# Patient Record
Sex: Female | Born: 1955 | Race: Black or African American | Hispanic: No | Marital: Married | Smoking: Former smoker
Health system: Southern US, Community
[De-identification: ages and names within clinical notes are randomized; demographics above are authoritative.]

## PROBLEM LIST (undated history)

## (undated) DIAGNOSIS — J45909 Unspecified asthma, uncomplicated: Secondary | ICD-10-CM

## (undated) DIAGNOSIS — J329 Chronic sinusitis, unspecified: Secondary | ICD-10-CM

## (undated) DIAGNOSIS — M722 Plantar fascial fibromatosis: Secondary | ICD-10-CM

## (undated) DIAGNOSIS — K219 Gastro-esophageal reflux disease without esophagitis: Secondary | ICD-10-CM

## (undated) DIAGNOSIS — J189 Pneumonia, unspecified organism: Secondary | ICD-10-CM

## (undated) DIAGNOSIS — M1990 Unspecified osteoarthritis, unspecified site: Secondary | ICD-10-CM

## (undated) DIAGNOSIS — E7800 Pure hypercholesterolemia, unspecified: Secondary | ICD-10-CM

## (undated) DIAGNOSIS — I10 Essential (primary) hypertension: Secondary | ICD-10-CM

## (undated) DIAGNOSIS — M7550 Bursitis of unspecified shoulder: Secondary | ICD-10-CM

## (undated) DIAGNOSIS — G5603 Carpal tunnel syndrome, bilateral upper limbs: Secondary | ICD-10-CM

## (undated) HISTORY — PX: TUBAL LIGATION: SHX77

## (undated) HISTORY — PX: APPENDECTOMY: SHX54

## (undated) HISTORY — PX: TONSILLECTOMY: SUR1361

## (undated) HISTORY — PX: BILATERAL CARPAL TUNNEL RELEASE: SHX6508

## (undated) HISTORY — PX: COLONOSCOPY: SHX174

---

## 1998-07-19 ENCOUNTER — Encounter: Admission: RE | Admit: 1998-07-19 | Discharge: 1998-07-19 | Payer: Self-pay | Admitting: *Deleted

## 2000-04-01 ENCOUNTER — Other Ambulatory Visit: Admission: RE | Admit: 2000-04-01 | Discharge: 2000-04-01 | Payer: Self-pay | Admitting: Family Medicine

## 2000-04-30 ENCOUNTER — Encounter: Payer: Self-pay | Admitting: Family Medicine

## 2000-04-30 ENCOUNTER — Encounter: Admission: RE | Admit: 2000-04-30 | Discharge: 2000-04-30 | Payer: Self-pay | Admitting: Family Medicine

## 2001-05-12 ENCOUNTER — Encounter: Payer: Self-pay | Admitting: Family Medicine

## 2001-05-12 ENCOUNTER — Encounter: Admission: RE | Admit: 2001-05-12 | Discharge: 2001-05-12 | Payer: Self-pay | Admitting: Family Medicine

## 2002-01-02 ENCOUNTER — Encounter: Admission: RE | Admit: 2002-01-02 | Discharge: 2002-01-02 | Payer: Self-pay | Admitting: Internal Medicine

## 2002-01-02 ENCOUNTER — Encounter: Payer: Self-pay | Admitting: Family Medicine

## 2002-02-08 ENCOUNTER — Encounter: Admission: RE | Admit: 2002-02-08 | Discharge: 2002-02-08 | Payer: Self-pay | Admitting: Family Medicine

## 2002-02-08 ENCOUNTER — Encounter: Payer: Self-pay | Admitting: Family Medicine

## 2002-02-09 ENCOUNTER — Encounter: Admission: RE | Admit: 2002-02-09 | Discharge: 2002-02-09 | Payer: Self-pay | Admitting: Family Medicine

## 2002-02-09 ENCOUNTER — Encounter: Payer: Self-pay | Admitting: Family Medicine

## 2002-08-28 ENCOUNTER — Encounter: Admission: RE | Admit: 2002-08-28 | Discharge: 2002-08-28 | Payer: Self-pay | Admitting: Family Medicine

## 2002-08-28 ENCOUNTER — Encounter: Payer: Self-pay | Admitting: Family Medicine

## 2003-03-15 ENCOUNTER — Ambulatory Visit (HOSPITAL_COMMUNITY): Admission: RE | Admit: 2003-03-15 | Discharge: 2003-03-15 | Payer: Self-pay | Admitting: Family Medicine

## 2003-03-15 ENCOUNTER — Encounter: Payer: Self-pay | Admitting: Internal Medicine

## 2003-03-15 ENCOUNTER — Ambulatory Visit (HOSPITAL_COMMUNITY): Admission: RE | Admit: 2003-03-15 | Discharge: 2003-03-15 | Payer: Self-pay | Admitting: Internal Medicine

## 2007-11-04 ENCOUNTER — Encounter: Admission: RE | Admit: 2007-11-04 | Discharge: 2007-11-04 | Payer: Self-pay | Admitting: Gastroenterology

## 2012-02-01 ENCOUNTER — Ambulatory Visit (INDEPENDENT_AMBULATORY_CARE_PROVIDER_SITE_OTHER): Payer: BC Managed Care – PPO | Admitting: Family Medicine

## 2012-02-01 VITALS — BP 135/88 | HR 94 | Temp 98.1°F | Resp 20 | Ht 63.0 in | Wt 208.0 lb

## 2012-02-01 DIAGNOSIS — J019 Acute sinusitis, unspecified: Secondary | ICD-10-CM

## 2012-02-01 NOTE — Progress Notes (Signed)
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 She has a one-week history of progressive sinus congestion, cough and facial pain. She's had some chills and is subjective fever.  She also had a dry cough.  Objective: Marked mucopurulent discharge with nasal passage swelling. TMs normal. Oropharynx red. Chest clear. Heart regular.  6 assessment: Sinusitis  Plan: Levaquin, Flonase

## 2012-06-23 ENCOUNTER — Other Ambulatory Visit: Payer: Self-pay | Admitting: Internal Medicine

## 2012-06-23 ENCOUNTER — Other Ambulatory Visit (HOSPITAL_COMMUNITY)
Admission: RE | Admit: 2012-06-23 | Discharge: 2012-06-23 | Disposition: A | Discharge status: Home / Self Care | Payer: BC Managed Care – PPO | Source: Ambulatory Visit | Attending: Internal Medicine | Admitting: Internal Medicine

## 2012-06-23 DIAGNOSIS — Z1151 Encounter for screening for human papillomavirus (HPV): Secondary | ICD-10-CM | POA: Insufficient documentation

## 2012-06-23 DIAGNOSIS — Z01419 Encounter for gynecological examination (general) (routine) without abnormal findings: Secondary | ICD-10-CM | POA: Insufficient documentation

## 2012-06-23 DIAGNOSIS — Z1231 Encounter for screening mammogram for malignant neoplasm of breast: Secondary | ICD-10-CM

## 2012-07-05 ENCOUNTER — Ambulatory Visit
Admission: RE | Admit: 2012-07-05 | Discharge: 2012-07-05 | Disposition: A | Discharge status: Home / Self Care | Payer: BC Managed Care – PPO | Source: Ambulatory Visit | Attending: Internal Medicine | Admitting: Internal Medicine

## 2012-07-05 DIAGNOSIS — Z1231 Encounter for screening mammogram for malignant neoplasm of breast: Secondary | ICD-10-CM

## 2013-03-10 ENCOUNTER — Other Ambulatory Visit: Payer: Self-pay

## 2013-06-30 ENCOUNTER — Other Ambulatory Visit (HOSPITAL_COMMUNITY)
Admission: RE | Admit: 2013-06-30 | Discharge: 2013-06-30 | Disposition: A | Discharge status: Home / Self Care | Payer: BC Managed Care – PPO | Source: Ambulatory Visit | Attending: Internal Medicine | Admitting: Internal Medicine

## 2013-06-30 ENCOUNTER — Other Ambulatory Visit: Payer: Self-pay | Admitting: Internal Medicine

## 2013-06-30 DIAGNOSIS — Z01419 Encounter for gynecological examination (general) (routine) without abnormal findings: Secondary | ICD-10-CM | POA: Insufficient documentation

## 2014-04-17 ENCOUNTER — Ambulatory Visit
Admission: RE | Admit: 2014-04-17 | Discharge: 2014-04-17 | Disposition: A | Discharge status: Home / Self Care | Payer: BC Managed Care – PPO | Source: Ambulatory Visit | Attending: Internal Medicine | Admitting: Internal Medicine

## 2014-04-17 ENCOUNTER — Other Ambulatory Visit: Payer: Self-pay | Admitting: Internal Medicine

## 2014-04-17 DIAGNOSIS — J209 Acute bronchitis, unspecified: Secondary | ICD-10-CM

## 2014-05-14 ENCOUNTER — Other Ambulatory Visit: Payer: Self-pay

## 2014-05-14 DIAGNOSIS — Z1231 Encounter for screening mammogram for malignant neoplasm of breast: Secondary | ICD-10-CM

## 2014-05-25 ENCOUNTER — Ambulatory Visit
Admission: RE | Admit: 2014-05-25 | Discharge: 2014-05-25 | Disposition: A | Discharge status: Home / Self Care | Payer: BC Managed Care – PPO | Source: Ambulatory Visit

## 2014-05-25 DIAGNOSIS — Z1231 Encounter for screening mammogram for malignant neoplasm of breast: Secondary | ICD-10-CM

## 2014-05-29 ENCOUNTER — Other Ambulatory Visit: Payer: Self-pay | Admitting: Internal Medicine

## 2014-05-29 DIAGNOSIS — R928 Other abnormal and inconclusive findings on diagnostic imaging of breast: Secondary | ICD-10-CM

## 2014-06-11 ENCOUNTER — Encounter (INDEPENDENT_AMBULATORY_CARE_PROVIDER_SITE_OTHER): Payer: Self-pay

## 2014-06-11 ENCOUNTER — Ambulatory Visit
Admission: RE | Admit: 2014-06-11 | Discharge: 2014-06-11 | Disposition: A | Discharge status: Home / Self Care | Payer: BC Managed Care – PPO | Source: Ambulatory Visit | Attending: Internal Medicine | Admitting: Internal Medicine

## 2014-06-11 DIAGNOSIS — R928 Other abnormal and inconclusive findings on diagnostic imaging of breast: Secondary | ICD-10-CM

## 2014-07-01 ENCOUNTER — Other Ambulatory Visit (HOSPITAL_COMMUNITY)
Admission: RE | Admit: 2014-07-01 | Discharge: 2014-07-01 | Disposition: A | Discharge status: Home / Self Care | Payer: BC Managed Care – PPO | Source: Ambulatory Visit | Attending: Internal Medicine | Admitting: Internal Medicine

## 2014-07-01 ENCOUNTER — Other Ambulatory Visit: Payer: Self-pay | Admitting: Internal Medicine

## 2014-07-01 DIAGNOSIS — Z01419 Encounter for gynecological examination (general) (routine) without abnormal findings: Secondary | ICD-10-CM | POA: Insufficient documentation

## 2014-07-04 LAB — CYTOLOGY - PAP

## 2014-09-25 ENCOUNTER — Emergency Department (HOSPITAL_COMMUNITY)
Admission: EM | Admit: 2014-09-25 | Discharge: 2014-09-25 | Disposition: A | Discharge status: Home / Self Care | Payer: BC Managed Care – PPO | Source: Home / Self Care | Attending: Family Medicine | Admitting: Family Medicine

## 2014-09-25 ENCOUNTER — Encounter (HOSPITAL_COMMUNITY): Payer: Self-pay | Admitting: Emergency Medicine

## 2014-09-25 DIAGNOSIS — J0101 Acute recurrent maxillary sinusitis: Secondary | ICD-10-CM

## 2014-09-25 MED ORDER — METHYLPREDNISOLONE ACETATE 80 MG/ML IJ SUSP
INTRAMUSCULAR | Status: AC
  Filled 2014-09-25: qty 1

## 2014-09-25 MED ORDER — IPRATROPIUM BROMIDE 0.02 % IN SOLN
0.5000 mg | Freq: Once | RESPIRATORY_TRACT | Status: AC
  Administered 2014-09-25: 0.5 mg via RESPIRATORY_TRACT

## 2014-09-25 MED ORDER — IPRATROPIUM BROMIDE 0.06 % NA SOLN: 2.0000 | Freq: Four times a day (QID) | NASAL | Status: DC

## 2014-09-25 MED ORDER — ALBUTEROL SULFATE (2.5 MG/3ML) 0.083% IN NEBU
5.0000 mg | INHALATION_SOLUTION | Freq: Once | RESPIRATORY_TRACT | Status: AC
  Administered 2014-09-25: 5 mg via RESPIRATORY_TRACT

## 2014-09-25 MED ORDER — MINOCYCLINE HCL 100 MG PO CAPS: 100.0000 mg | ORAL_CAPSULE | Freq: Two times a day (BID) | ORAL | Status: DC

## 2014-09-25 MED ORDER — METHYLPREDNISOLONE ACETATE 40 MG/ML IJ SUSP
80.0000 mg | Freq: Once | INTRAMUSCULAR | Status: AC
  Administered 2014-09-25: 80 mg via INTRAMUSCULAR

## 2014-09-25 MED ORDER — IPRATROPIUM BROMIDE 0.02 % IN SOLN
RESPIRATORY_TRACT | Status: AC
  Filled 2014-09-25: qty 2.5

## 2014-09-25 MED ORDER — TRIAMCINOLONE ACETONIDE 40 MG/ML IJ SUSP
40.0000 mg | Freq: Once | INTRAMUSCULAR | Status: AC
  Administered 2014-09-25: 40 mg via INTRAMUSCULAR

## 2014-09-25 MED ORDER — TRIAMCINOLONE ACETONIDE 40 MG/ML IJ SUSP
INTRAMUSCULAR | Status: AC
  Filled 2014-09-25: qty 1

## 2014-09-25 MED ORDER — ALBUTEROL SULFATE (2.5 MG/3ML) 0.083% IN NEBU
INHALATION_SOLUTION | RESPIRATORY_TRACT | Status: AC
Start: 2014-09-25 — End: 2014-09-25
  Filled 2014-09-25: qty 6

## 2014-09-25 NOTE — Discharge Instructions (Signed)
Drink plenty of fluids as discussed, use medicine as prescribed, and mucinex or delsym for cough. Return or see your doctor if further problems °

## 2014-09-25 NOTE — ED Provider Notes (Signed)
CSN: [id number]     Arrival date & time 09/25/14  [id number] History   First MD Initiated Contact with Patient 09/25/14 [phone]     Chief Complaint  Patient presents with  . URI   (Consider location/radiation/quality/duration/timing/severity/associated sxs/prior Treatment) Patient is a 58 y.o. female presenting with URI. The history is provided by the patient.  URI Presenting symptoms: congestion, cough, facial pain and rhinorrhea   Presenting symptoms: no fever   Severity:  Mild Onset quality:  Gradual Duration:  3 weeks Progression:  Unchanged Chronicity:  New Relieved by:  None tried Ineffective treatments:  None tried Associated symptoms: sinus pain and sneezing   Associated symptoms: no wheezing   Associated symptoms comment:  Sx of vertigo, n/v. Risk factors comment:  Asthma   History reviewed. No pertinent past medical history. History reviewed. No pertinent past surgical history. History reviewed. No pertinent family history. History  Substance Use Topics  . Smoking status: Former Smoker    Types: Cigarettes    Quit date: 01/31/1973  . Smokeless tobacco: Not on file  . Alcohol Use: Not on file   OB History   Grav Para Term Preterm Abortions TAB SAB Ect Mult Living                 Review of Systems  Constitutional: Negative.  Negative for fever and chills.  HENT: Positive for congestion, postnasal drip, rhinorrhea and sneezing.   Respiratory: Positive for cough. Negative for wheezing.   Cardiovascular: Negative.   Neurological: Positive for dizziness.    Allergies  Penicillins  Home Medications   Prior to Admission medications   Medication Sig Start Date End Date Taking? Authorizing Provider  amLODipine (NORVASC) 10 MG tablet Take 10 mg by mouth daily.   Yes Historical Provider, MD  ATORVASTATIN CALCIUM PO Take by mouth.   Yes Historical Provider, MD  albuterol (PROVENTIL HFA;VENTOLIN HFA) 108 (90 BASE) MCG/ACT inhaler Inhale 2 puffs into the lungs as needed.     Historical Provider, MD  ipratropium (ATROVENT) 0.06 % nasal spray Place 2 sprays into both nostrils 4 (four) times daily. 09/25/14   Linna HoffJames D Morse Brueggemann, MD  minocycline (MINOCIN,DYNACIN) 100 MG capsule Take 1 capsule (100 mg total) by mouth 2 (two) times daily. 09/25/14   Linna HoffJames D Tasheena Wambolt, MD  montelukast (SINGULAIR) 10 MG tablet Take 10 mg by mouth as needed.    Historical Provider, MD   BP 113/78  Pulse 86  Temp(Src) 98.3 F (36.8 C) (Oral)  Resp 16  SpO2 94%  LMP 02/01/1992 Physical Exam  Nursing note and vitals reviewed. Constitutional: She is oriented to person, place, and time. She appears well-developed and well-nourished.  HENT:  Head: Normocephalic.  Right Ear: External ear normal.  Left Ear: External ear normal.  Nose: Mucosal edema and rhinorrhea present.  Mouth/Throat: Oropharynx is clear and moist.  Eyes: Conjunctivae and EOM are normal. Pupils are equal, round, and reactive to light.  Neck: Normal range of motion. Neck supple.  Cardiovascular: Normal heart sounds and intact distal pulses.   Pulmonary/Chest: Effort normal. She has wheezes.  Lymphadenopathy:    She has no cervical adenopathy.  Neurological: She is alert and oriented to person, place, and time.  Skin: Skin is warm and dry.    ED Course  Procedures (including critical care time) Labs Review Labs Reviewed - No data to display  Imaging Review No results found.   MDM   1. Acute recurrent maxillary sinusitis   has had seasonal flu shot  recently    Linna HoffJames D Izyk Marty, MD 09/25/14 [phone]

## 2014-09-25 NOTE — ED Notes (Signed)
Pt  Reports   Symptoms  Of  Cough  Congestion         Sinus     Drainage           X  sev  Weeks    yest  Developed   Nausea   Vomiting   With  Some  dizzyness  As   Well        Symptoms  Not  releived  By  otc  meds

## 2015-06-11 ENCOUNTER — Other Ambulatory Visit: Payer: Self-pay

## 2015-06-11 DIAGNOSIS — Z1231 Encounter for screening mammogram for malignant neoplasm of breast: Secondary | ICD-10-CM

## 2015-07-16 ENCOUNTER — Ambulatory Visit: Payer: BC Managed Care – PPO

## 2015-08-22 ENCOUNTER — Ambulatory Visit
Admission: RE | Admit: 2015-08-22 | Discharge: 2015-08-22 | Disposition: A | Discharge status: Home / Self Care | Payer: BC Managed Care – PPO | Source: Ambulatory Visit

## 2015-08-22 DIAGNOSIS — Z1231 Encounter for screening mammogram for malignant neoplasm of breast: Secondary | ICD-10-CM

## 2016-01-21 ENCOUNTER — Other Ambulatory Visit (HOSPITAL_COMMUNITY)
Admission: RE | Admit: 2016-01-21 | Discharge: 2016-01-21 | Disposition: A | Discharge status: Home / Self Care | Payer: BC Managed Care – PPO | Source: Ambulatory Visit | Attending: Obstetrics and Gynecology | Admitting: Obstetrics and Gynecology

## 2016-01-21 ENCOUNTER — Other Ambulatory Visit: Payer: Self-pay | Admitting: Obstetrics and Gynecology

## 2016-01-21 DIAGNOSIS — Z1151 Encounter for screening for human papillomavirus (HPV): Secondary | ICD-10-CM | POA: Diagnosis present

## 2016-01-21 DIAGNOSIS — Z01411 Encounter for gynecological examination (general) (routine) with abnormal findings: Secondary | ICD-10-CM | POA: Diagnosis present

## 2016-01-23 LAB — CYTOLOGY - PAP

## 2016-03-12 ENCOUNTER — Other Ambulatory Visit (HOSPITAL_COMMUNITY): Payer: Self-pay | Admitting: Obstetrics and Gynecology

## 2016-03-12 NOTE — H&P (Signed)
Reason for Appointment  1. PreOp for 03/18/16   History of Present Illness  General:  60 y/o presents for preop visit. she is scheduled for hysteroscopy D&C with possible polypectomy on 03/18/2016. she was referred by Dr. Renford Dillsonald Polite for the evaluaiton of postmenopausal bleeding. she noticed spotting in November 2016. she notieced it again in December 2016 that lasted 2-3 days. It was very light. she has been postmenopausal for 20 years.  she is not sexually active. Ultrasound performed 01/23/2016 shows an 8.4 cm x 4.6 cm x 4.6 cm uterus. 2 small fibroids were measured the largest in 1.3 cm. Her enometrium is thickend with a hyperechoic mass 1.1 cm. her ovaries appear normal bilaterally.     Current Medications  Taking   Omega 3 1000 MG Capsule 1 capsule with a meal Orally Once a day   MVI as directed   Calcium 600+D(Calcium-Vitamin D) 600-400 MG-UNIT Tablet 1 tablet with food Orally Once a day   Biotin 1000 MCG Tablet 1 tablet Orally Once a day   Flexeril(Cyclobenzaprine HCl) 10 MG Tablet 1 tablet Orally prn   Albuterol Sulfate HFA 108 (90 Base) MCG/ACT Aerosol Solution 2 puffs as needed Inhalation prn   Naphcon-A(Naphazoline-Pheniramine) 0.025-0.3 % Solution 1-2 drops into affected eye as needed Ophthalmic 3-4 times a day   Singulair(Montelukast Sodium) 10 MG Tablet 1 tablet in the evening Orally Once a day   Norvasc(AmLODIPine Besylate) 5 MG Tablet 1 tablet Orally Once a day   Fluticasone Propionate 50 MCG/ACT Suspension 2 sprays Nasally Once a day   Zetia(Ezetimibe) 10 MG Tablet 1 tablet Orally Once a day   HCTZ 25 MG . Tablet 1 tablet Orally q AM   Not-Taking/PRN   Tramadol HCl 50 MG Tablet 1 tablet as needed Orally every 12 hrs prn   Norco(Acetaminophen-Hydrocodone) 5-325 MG Tablet 1 tablet as needed Orally every 12 hrs prn   Ibuprofen 600 MG Tablet 1 tablet Orally Three times a day prn   Discontinued   Azithromycin 250 MG Tablet 2 tablets on the first day, then 1 tablet daily  for 4 days Orally Once a day   Medication List reviewed and reconciled with the patient    Past Medical History  Arthritis knees, hands, back.   Asthma.   Hypercholesterolemia.   Overweight.   Low back pain.   Hypertension.   Plantar fasciitis, bilateral.   Jehovah's Witness no blood products.   postmenopausal vaginal bleeding 12/2015, to be eval by GYN.    Surgical History  appendectomy   carpal tunnel release B/L   C section   tubal ligation   mole removed left leg 2014    Family History  Father: deceased, heart disease, MI, diagnosed with CAD  Mother: deceased, heart disease, Cancer Throat, smoker, diagnosed with CAD  Sister 1: deceased, died at birth  1 sister(s) . 2 son(s) , 1 daughter(s) .   sister died at birth; denies family h/o gyn cancers.   Social History  General:  Tobacco use cigarettes: Never smoked, Tobacco history last updated 02/19/2016, Additional Findings: Tobacco Non-User Non-smoker for religious reasons. no Alcohol. Caffeine: yes. no Recreational drug use. Marital Status: married. Children: 2, Boys, 1, daughter (s). 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    Gyn History  Sexual activity not currently sexually active.  Periods : spotting in Nov and Dec 2017.  Last pap smear date 01/21/16.  Last mammogram date 09/2015.    OB History  Number of pregnancies 4.  miscarriages 1.  Pregnancy # 1 live birth, vaginal delivery, girl.  Pregnancy # 2 miscarriage.  Pregnancy # 3 live birth, vaginal delivery, boy.  Pregnancy # 4: live birth, C-section, boy .    Allergies  Penicillin (for allergy): rash  Crestor: dizziness: Side Effects   Hospitalization/Major Diagnostic Procedure  surgery   child birth x 3    Review of Systems  CONSTITUTIONAL:  Fatigue none. Fever none today.  CARDIOLOGY:  Chest pain none.  RESPIRATORY:  Shortness of breath no. Cough no.  GASTROENTEROLOGY:  Appetite change none. Change in bowel habits no.   FEMALE REPRODUCTIVE:  Breast lumps or discharge no. Breast pain none. Dyspareunia none. Dysuria no. Pelvic pain none. Regular menses no. Unusual vaginal discharge no. Vaginal itching no. Vulvar/labial lesion no.  NEUROLOGY:  Migraines none. Tingling/numbness none. Visual changes none.  PSYCHOLOGY:  Depression no.  SKIN:  Rash no. Suspicious lesions no.  ENDOCRINOLOGY:  Hot flashes none. Weight gain no unintentional. Weight loss none.  HEMATOLOGY/LYMPH:  Anemia no.      Vital Signs  Wt 210, Wt change -4 lb, Pulse sitting 75, BP sitting 101/64.   Examination  General Examination: CHAPERONE PRESENT McCoy,Tiffany 02/19/2016 02:03:45 PM > , for pelvic exam only.     Physical Examination  GENERAL:  Patient appears in NAD, pleasant. Build: well developed. General Appearance: overweight. Race: african-american.  LUNGS:  Breath sounds: clear to auscultation. Dyspnea: no.  HEART:  Murmurs: none. Rate: normal. Rhythm: regular.  ABDOMEN:  General: no masses,tenderness,organomegaly, no CVAT.  FEMALE GENITOURINARY:  Adnexa: no mass, non tender. Anus/perineum: normal, no lesions. Cervix/ cuff: normal appearance , no lesions/discharge/bleeding, , good pelvic support , external os normal . External genitalia: normal, no lesions, no skin discoloration, no lymphadenopathy. Urethra: normal external meatus. Uterus: normal size/shape/consistency, freely mobile, non tender. Rectum: deferred. Vagina: pink/moist mucosa, no lesions, no abnormal discharge, odorless. Vulva: normal, no lesions, no skin discoloration, non tender.  EXTREMITIES:  Extremities FROM of all extremities.  NEUROLOGICAL:  Gait: normal. Orientation: alert and oriented x 3.     Assessments   1. Postmenopausal bleeding - N95.0 (Primary), Awaiting further evaluation by GYN   Treatment  1. Postmenopausal bleeding  Notes: I discussed with the patient em biopsy in the office vs . hysteroscopy D&C . she desires hysteroscopy D&C.  r/o surgery were discussed infection bleeding damage to uterus/ uterine perforation with need for further surgery. pt voiced understanding and desires to proceed.

## 2016-03-16 ENCOUNTER — Encounter (HOSPITAL_COMMUNITY)
Admission: RE | Admit: 2016-03-16 | Discharge: 2016-03-16 | Disposition: A | Discharge status: Home / Self Care | Payer: BC Managed Care – PPO | Source: Ambulatory Visit | Attending: Obstetrics and Gynecology | Admitting: Obstetrics and Gynecology

## 2016-03-16 ENCOUNTER — Other Ambulatory Visit: Payer: Self-pay

## 2016-03-16 ENCOUNTER — Encounter (HOSPITAL_COMMUNITY): Payer: Self-pay

## 2016-03-16 DIAGNOSIS — K219 Gastro-esophageal reflux disease without esophagitis: Secondary | ICD-10-CM | POA: Diagnosis not present

## 2016-03-16 DIAGNOSIS — Z87891 Personal history of nicotine dependence: Secondary | ICD-10-CM | POA: Diagnosis not present

## 2016-03-16 DIAGNOSIS — I10 Essential (primary) hypertension: Secondary | ICD-10-CM | POA: Diagnosis not present

## 2016-03-16 DIAGNOSIS — J45909 Unspecified asthma, uncomplicated: Secondary | ICD-10-CM | POA: Diagnosis not present

## 2016-03-16 DIAGNOSIS — Z6841 Body Mass Index (BMI) 40.0 and over, adult: Secondary | ICD-10-CM | POA: Diagnosis not present

## 2016-03-16 DIAGNOSIS — N950 Postmenopausal bleeding: Secondary | ICD-10-CM | POA: Diagnosis present

## 2016-03-16 HISTORY — DX: Unspecified asthma, uncomplicated: J45.909

## 2016-03-16 HISTORY — DX: Unspecified osteoarthritis, unspecified site: M19.90

## 2016-03-16 HISTORY — DX: Chronic sinusitis, unspecified: J32.9

## 2016-03-16 HISTORY — DX: Essential (primary) hypertension: I10

## 2016-03-16 HISTORY — DX: Carpal tunnel syndrome, bilateral upper limbs: G56.03

## 2016-03-16 HISTORY — DX: Gastro-esophageal reflux disease without esophagitis: K21.9

## 2016-03-16 HISTORY — DX: Pneumonia, unspecified organism: J18.9

## 2016-03-16 HISTORY — DX: Bursitis of unspecified shoulder: M75.50

## 2016-03-16 HISTORY — DX: Plantar fascial fibromatosis: M72.2

## 2016-03-16 LAB — CBC
HCT: 39.2 % (ref 36.0–46.0)
HEMOGLOBIN: 13.2 g/dL (ref 12.0–15.0)
MCH: 30.5 pg (ref 26.0–34.0)
MCHC: 33.7 g/dL (ref 30.0–36.0)
MCV: 90.5 fL (ref 78.0–100.0)
Platelets: 329 10*3/uL (ref 150–400)
RBC: 4.33 MIL/uL (ref 3.87–5.11)
RDW: 13.8 % (ref 11.5–15.5)
WBC: 11.3 10*3/uL — ABNORMAL HIGH (ref 4.0–10.5)

## 2016-03-16 LAB — BASIC METABOLIC PANEL
Anion gap: 10 (ref 5–15)
BUN: 19 mg/dL (ref 6–20)
CHLORIDE: 103 mmol/L (ref 101–111)
CO2: 29 mmol/L (ref 22–32)
Calcium: 9.5 mg/dL (ref 8.9–10.3)
Creatinine, Ser: 1.25 mg/dL — ABNORMAL HIGH (ref 0.44–1.00)
GFR calc Af Amer: 53 mL/min — ABNORMAL LOW (ref >60)
GFR calc non Af Amer: 45 mL/min — ABNORMAL LOW (ref >60)
GLUCOSE: 102 mg/dL — AB (ref 65–99)
POTASSIUM: 3.2 mmol/L — AB (ref 3.5–5.1)
Sodium: 142 mmol/L (ref 135–145)

## 2016-03-16 NOTE — Patient Instructions (Signed)
Your procedure is scheduled on:  Wednesday, March 18, 2016  [facility] at: 7:00 AM  Pick up the phone at the desk and dial [phone].  Call this number if you have problems the morning of surgery: [phone].  Remember:  Do NOT eat food or drink after:  Midnight Tuesday  Take these medicines the morning of surgery with a SIP OF WATER:  Amlodipine, Hydrochlorothiazide  Bring Asthma Inhaler day of surgery.  Do NOT wear jewelry (body piercing), metal hair clips/bobby pins, make-up, or nail polish. Do NOT wear lotions, powders, or perfumes.  You may wear deodorant. Do NOT shave for 48 hours prior to surgery. Do NOT bring valuables to the hospital. Contacts, dentures, or bridgework may not be worn into surgery.  Have a responsible adult drive you home and stay with you for 24 hours after your procedure

## 2016-03-17 NOTE — Anesthesia Preprocedure Evaluation (Signed)
Anesthesia Evaluation  Patient identified by MRN, date of birth, ID band Patient awake    Reviewed: Allergy & Precautions, NPO status , Patient's Chart, lab work & pertinent test results  Airway Mallampati: II  TM Distance: >3 FB Neck ROM: Full    Dental no notable dental hx.    Pulmonary asthma , pneumonia, former smoker,    Pulmonary exam normal breath sounds clear to auscultation       Cardiovascular hypertension, Pt. on medications Normal cardiovascular exam Rhythm:Regular Rate:Normal     Neuro/Psych negative neurological ROS  negative psych ROS   GI/Hepatic Neg liver ROS, GERD  ,  Endo/Other  Morbid obesity  Renal/GU negative Renal ROS     Musculoskeletal negative musculoskeletal ROS (+) Arthritis ,   Abdominal (+) + obese,   Peds  Hematology negative hematology ROS (+)   Anesthesia Other Findings   Reproductive/Obstetrics negative OB ROS                             Anesthesia Physical Anesthesia Plan  ASA: III  Anesthesia Plan: General   Post-op Pain Management:    Induction: Intravenous  Airway Management Planned: LMA  Additional Equipment:   Intra-op Plan:   Post-operative Plan: Extubation in OR  Informed Consent: I have reviewed the patients History and Physical, chart, labs and discussed the procedure including the risks, benefits and alternatives for the proposed anesthesia with the patient or authorized representative who has indicated his/her understanding and acceptance.   Dental advisory given  Plan Discussed with: CRNA  Anesthesia Plan Comments:         Anesthesia Quick Evaluation

## 2016-03-18 ENCOUNTER — Ambulatory Visit (HOSPITAL_COMMUNITY): Payer: BC Managed Care – PPO | Admitting: Anesthesiology

## 2016-03-18 ENCOUNTER — Encounter (HOSPITAL_COMMUNITY)
Admission: RE | Disposition: A | Discharge status: Home / Self Care | Payer: Self-pay | Source: Ambulatory Visit | Attending: Obstetrics and Gynecology

## 2016-03-18 ENCOUNTER — Ambulatory Visit (HOSPITAL_COMMUNITY)
Admission: RE | Admit: 2016-03-18 | Discharge: 2016-03-18 | Disposition: A | Discharge status: Home / Self Care | Payer: BC Managed Care – PPO | Source: Ambulatory Visit | Attending: Obstetrics and Gynecology | Admitting: Obstetrics and Gynecology

## 2016-03-18 DIAGNOSIS — Z87891 Personal history of nicotine dependence: Secondary | ICD-10-CM | POA: Insufficient documentation

## 2016-03-18 DIAGNOSIS — N840 Polyp of corpus uteri: Secondary | ICD-10-CM | POA: Diagnosis present

## 2016-03-18 DIAGNOSIS — K219 Gastro-esophageal reflux disease without esophagitis: Secondary | ICD-10-CM | POA: Insufficient documentation

## 2016-03-18 DIAGNOSIS — N950 Postmenopausal bleeding: Secondary | ICD-10-CM | POA: Diagnosis not present

## 2016-03-18 DIAGNOSIS — J45909 Unspecified asthma, uncomplicated: Secondary | ICD-10-CM | POA: Insufficient documentation

## 2016-03-18 DIAGNOSIS — I10 Essential (primary) hypertension: Secondary | ICD-10-CM | POA: Insufficient documentation

## 2016-03-18 DIAGNOSIS — Z6841 Body Mass Index (BMI) 40.0 and over, adult: Secondary | ICD-10-CM | POA: Insufficient documentation

## 2016-03-18 HISTORY — PX: DILATATION & CURETTAGE/HYSTEROSCOPY WITH MYOSURE: SHX6511

## 2016-03-18 SURGERY — DILATATION & CURETTAGE/HYSTEROSCOPY WITH MYOSURE
Anesthesia: General | Site: Vagina

## 2016-03-18 MED ORDER — FENTANYL CITRATE (PF) 100 MCG/2ML IJ SOLN
INTRAMUSCULAR | Status: DC | PRN
  Administered 2016-03-18 (×2): 50 ug via INTRAVENOUS
  Administered 2016-03-18: 100 ug via INTRAVENOUS

## 2016-03-18 MED ORDER — IBUPROFEN 200 MG PO TABS: 200.0000 mg | ORAL_TABLET | Freq: Four times a day (QID) | ORAL | Status: DC | PRN

## 2016-03-18 MED ORDER — PROPOFOL 10 MG/ML IV BOLUS
INTRAVENOUS | Status: AC
  Filled 2016-03-18: qty 20

## 2016-03-18 MED ORDER — FENTANYL CITRATE (PF) 100 MCG/2ML IJ SOLN
INTRAMUSCULAR | Status: AC
  Filled 2016-03-18: qty 2

## 2016-03-18 MED ORDER — ONDANSETRON HCL 4 MG/2ML IJ SOLN
INTRAMUSCULAR | Status: AC
  Filled 2016-03-18: qty 2

## 2016-03-18 MED ORDER — OXYCODONE HCL 5 MG PO TABS: 5.0000 mg | ORAL_TABLET | Freq: Once | ORAL | Status: DC | PRN

## 2016-03-18 MED ORDER — SCOPOLAMINE 1 MG/3DAYS TD PT72
1.0000 | MEDICATED_PATCH | Freq: Once | TRANSDERMAL | Status: DC
  Administered 2016-03-18: 1.5 mg via TRANSDERMAL

## 2016-03-18 MED ORDER — LACTATED RINGERS IV SOLN
INTRAVENOUS | Status: DC
  Administered 2016-03-18: 07:00:00 via INTRAVENOUS

## 2016-03-18 MED ORDER — KETOROLAC TROMETHAMINE 30 MG/ML IJ SOLN
INTRAMUSCULAR | Status: DC | PRN
  Administered 2016-03-18: 30 mg via INTRAVENOUS

## 2016-03-18 MED ORDER — ALBUTEROL SULFATE HFA 108 (90 BASE) MCG/ACT IN AERS
INHALATION_SPRAY | RESPIRATORY_TRACT | Status: DC | PRN
  Administered 2016-03-18: 2 via RESPIRATORY_TRACT

## 2016-03-18 MED ORDER — MIDAZOLAM HCL 2 MG/2ML IJ SOLN
INTRAMUSCULAR | Status: AC
Start: 2016-03-18 — End: 2016-03-18
  Filled 2016-03-18: qty 2

## 2016-03-18 MED ORDER — PROPOFOL 10 MG/ML IV BOLUS
INTRAVENOUS | Status: DC | PRN
  Administered 2016-03-18: 150 mg via INTRAVENOUS

## 2016-03-18 MED ORDER — ONDANSETRON HCL 4 MG/2ML IJ SOLN
INTRAMUSCULAR | Status: DC | PRN
  Administered 2016-03-18: 4 mg via INTRAVENOUS

## 2016-03-18 MED ORDER — MIDAZOLAM HCL 2 MG/2ML IJ SOLN
INTRAMUSCULAR | Status: DC | PRN
  Administered 2016-03-18 (×2): 1 mg via INTRAVENOUS

## 2016-03-18 MED ORDER — LIDOCAINE HCL (CARDIAC) 20 MG/ML IV SOLN
INTRAVENOUS | Status: DC | PRN
  Administered 2016-03-18: 50 mg via INTRAVENOUS

## 2016-03-18 MED ORDER — HYDROMORPHONE HCL 1 MG/ML IJ SOLN: 0.2500 mg | INTRAMUSCULAR | Status: DC | PRN

## 2016-03-18 MED ORDER — OXYCODONE HCL 5 MG/5ML PO SOLN: 5.0000 mg | Freq: Once | ORAL | Status: DC | PRN

## 2016-03-18 MED ORDER — BUPIVACAINE HCL (PF) 0.25 % IJ SOLN
INTRAMUSCULAR | Status: DC | PRN
  Administered 2016-03-18: 20 mL

## 2016-03-18 MED ORDER — DEXAMETHASONE SODIUM PHOSPHATE 4 MG/ML IJ SOLN
INTRAMUSCULAR | Status: AC
Start: 2016-03-18 — End: 2016-03-18
  Filled 2016-03-18: qty 1

## 2016-03-18 MED ORDER — DEXAMETHASONE SODIUM PHOSPHATE 10 MG/ML IJ SOLN
INTRAMUSCULAR | Status: DC | PRN
  Administered 2016-03-18: 4 mg via INTRAVENOUS

## 2016-03-18 MED ORDER — SCOPOLAMINE 1 MG/3DAYS TD PT72
MEDICATED_PATCH | TRANSDERMAL | Status: AC
  Administered 2016-03-18: 1.5 mg via TRANSDERMAL
  Filled 2016-03-18: qty 1

## 2016-03-18 MED ORDER — BUPIVACAINE HCL (PF) 0.25 % IJ SOLN
INTRAMUSCULAR | Status: AC
  Filled 2016-03-18: qty 30

## 2016-03-18 MED ORDER — MEPERIDINE HCL 25 MG/ML IJ SOLN: 6.2500 mg | INTRAMUSCULAR | Status: DC | PRN

## 2016-03-18 MED ORDER — LIDOCAINE HCL (CARDIAC) 20 MG/ML IV SOLN
INTRAVENOUS | Status: AC
  Filled 2016-03-18: qty 5

## 2016-03-18 SURGICAL SUPPLY — 22 items
CANISTER SUCT 3000ML (MISCELLANEOUS) ×3 IMPLANT
CATH ROBINSON RED A/P 16FR (CATHETERS) ×3 IMPLANT
CLOTH BEACON ORANGE TIMEOUT ST (SAFETY) ×3 IMPLANT
CONTAINER PREFILL 10% NBF 60ML (FORM) ×6 IMPLANT
DEVICE MYOSURE LITE (MISCELLANEOUS) ×3 IMPLANT
DEVICE MYOSURE REACH (MISCELLANEOUS) IMPLANT
ELECT REM PT RETURN 9FT ADLT (ELECTROSURGICAL) ×3
ELECTRODE REM PT RTRN 9FT ADLT (ELECTROSURGICAL) ×1 IMPLANT
FILTER ARTHROSCOPY CONVERTOR (FILTER) ×3 IMPLANT
GLOVE BIOGEL M 6.5 STRL (GLOVE) ×6 IMPLANT
GLOVE BIOGEL PI IND STRL 6.5 (GLOVE) ×1 IMPLANT
GLOVE BIOGEL PI IND STRL 7.0 (GLOVE) ×1 IMPLANT
GLOVE BIOGEL PI INDICATOR 6.5 (GLOVE) ×2
GLOVE BIOGEL PI INDICATOR 7.0 (GLOVE) ×2
GOWN STRL REUS W/TWL LRG LVL3 (GOWN DISPOSABLE) ×6 IMPLANT
PACK VAGINAL MINOR WOMEN LF (CUSTOM PROCEDURE TRAY) ×3 IMPLANT
PAD OB MATERNITY 4.3X12.25 (PERSONAL CARE ITEMS) ×3 IMPLANT
SEAL ROD LENS SCOPE MYOSURE (ABLATOR) ×3 IMPLANT
TOWEL OR 17X24 6PK STRL BLUE (TOWEL DISPOSABLE) ×6 IMPLANT
TUBING AQUILEX INFLOW (TUBING) ×3 IMPLANT
TUBING AQUILEX OUTFLOW (TUBING) ×3 IMPLANT
WATER STERILE IRR 1000ML POUR (IV SOLUTION) IMPLANT

## 2016-03-18 NOTE — Anesthesia Postprocedure Evaluation (Signed)
Anesthesia Post Note  Patient: Patricia KelpDorothy Small  Procedure(s) Performed: Procedure(s) (LRB): DILATATION & CURETTAGE/HYSTEROSCOPY WITH MYOSURE (N/A)  Patient location during evaluation: PACU Anesthesia Type: General Level of consciousness: sedated and patient cooperative Pain management: pain level controlled Vital Signs Assessment: post-procedure vital signs reviewed and stable Respiratory status: spontaneous breathing Cardiovascular status: stable Anesthetic complications: no    Last Vitals:  Filed Vitals:   03/18/16 1045 03/18/16 1116  BP: 111/78 121/73  Pulse: 93 88  Temp: 36.7 C 36.6 C  Resp: 12 16    Last Pain: There were no vitals filed for this visit.               Lewie LoronJohn Milon Dethloff

## 2016-03-18 NOTE — Transfer of Care (Signed)
Immediate Anesthesia Transfer of Care Note  Patient: Patricia Small  Procedure(s) Performed: Procedure(s) with comments: DILATATION & CURETTAGE/HYSTEROSCOPY WITH MYOSURE (N/A) - Polypectomy  Patient Location: PACU  Anesthesia Type:General  Level of Consciousness: awake, alert  and oriented  Airway & Oxygen Therapy: Patient Spontanous Breathing and Patient connected to nasal cannula oxygen  Post-op Assessment: Report given to RN and Post -op Vital signs reviewed and stable  Post vital signs: Reviewed and stable  Last Vitals:  Filed Vitals:   03/18/16 1000 03/18/16 1015  BP: 117/78 107/81  Pulse: 86 87  Temp:    Resp: 16 14    Complications: No apparent anesthesia complications

## 2016-03-18 NOTE — H&P (Signed)
  Date of Initial H&P: 03/12/2016  History reviewed, patient examined, no change in status, stable for surgery. 

## 2016-03-18 NOTE — Discharge Instructions (Signed)
DISCHARGE INSTRUCTIONS: HYSTEROSCOPY / ENDOMETRIAL ABLATION The following instructions have been prepared to help you care for yourself upon your return home.  May Remove Scop patch on or before 03/20/16  May take Ibuprofen after 3:00 PM if needed for cramps/pain  Personal hygiene:  Use sanitary pads for vaginal drainage, not tampons.  Shower the day after your procedure.  NO tub baths, pools or Jacuzzis for 2-3 weeks.  Wipe front to back after using the bathroom.  Activity and limitations:  Do NOT drive or operate any equipment for 24 hours. The effects of anesthesia are still present and drowsiness may result.  Do NOT rest in bed all day.  Walking is encouraged.  Walk up and down stairs slowly.  You may resume your normal activity in one to two days or as indicated by your physician.  Sexual activity: NO intercourse for at least 2 weeks after the procedure, or as indicated by your Doctor.  Diet: Eat a light meal as desired this evening. You may resume your usual diet tomorrow.  Return to Work: You may resume your work activities in one to two days or as indicated by Therapist, sportsyour Doctor.  What to expect after your surgery: Expect to have vaginal bleeding/discharge for 2-3 days and spotting for up to 10 days. It is not unusual to have soreness for up to 1-2 weeks. You may have a slight burning sensation when you urinate for the first day. Mild cramps may continue for a couple of days. You may have a regular period in 2-6 weeks.  Call your doctor for any of the following:  Excessive vaginal bleeding or clotting, saturating and changing one pad every hour.  Inability to urinate 6 hours after discharge from hospital.  Pain not relieved by pain medication.  Fever of 100.4 F or greater.  Unusual vaginal discharge or odor.

## 2016-03-18 NOTE — Anesthesia Procedure Notes (Signed)
Procedure Name: LMA Insertion Date/Time: 03/18/2016 8:44 AM Performed by: Shanon PayorGREGORY, Korena Nass M Pre-anesthesia Checklist: Patient identified, Emergency Drugs available, Suction available, Patient being monitored and Timeout performed Patient Re-evaluated:Patient Re-evaluated prior to inductionOxygen Delivery Method: Circle system utilized Preoxygenation: Pre-oxygenation with 100% oxygen Intubation Type: IV induction LMA: LMA inserted LMA Size: 4.0 Number of attempts: 1 Placement Confirmation: positive ETCO2 and breath sounds checked- equal and bilateral Tube secured with: Tape Dental Injury: Teeth and Oropharynx as per pre-operative assessment

## 2016-03-19 ENCOUNTER — Encounter (HOSPITAL_COMMUNITY): Payer: Self-pay | Admitting: Obstetrics and Gynecology

## 2016-04-03 NOTE — Op Note (Signed)
03/18/2016  11:48 AM  PATIENT:  Patricia Small  [age] y.o. female  PRE-OPERATIVE DIAGNOSIS:  N95.0 PMB  POST-OPERATIVE DIAGNOSIS:  post menopausal bleeding  PROCEDURE:  Procedure(s) with comments: DILATATION & CURETTAGE/HYSTEROSCOPY WITH MYOSURE (N/A) - Polypectomy  SURGEON:  Surgeon(s) and Role:    * Gerald Leitzara Joaquim Tolen, MD - Primary  PHYSICIAN ASSISTANT:   ASSISTANTS: none   ANESTHESIA:   general  EBL:     BLOOD ADMINISTERED:none  DRAINS: none   LOCAL MEDICATIONS USED:  MARCAINE     SPECIMEN:  Source of Specimen:  endometrial currettings and polyp  DISPOSITION OF SPECIMEN:  PATHOLOGY  COUNTS:  YES  TOURNIQUET:  * No tourniquets in log *  DICTATION: .Dragon Dictation  PLAN OF CARE: Discharge to home after PACU  PATIENT DISPOSITION:  PACU - hemodynamically stable.   Delay start of Pharmacological VTE agent (>24hrs) due to surgical blood loss or risk of bleeding: not applicable   Procedure: Patient was taken to the operating room where she was placed under general anesthesia. She was placed in the dorsal lithotomy position. She was prepped and draped in the usual sterile fashion. A speculum was placed into the vaginal vault. The anterior lip of the cervix was grasped with a single-tooth tenaculum. Quarter percent Marcaine was injected at the 4 and 8:00 positions of the cervix. The cervix was then sounded to 7 cm. The cervix was dilated to approximately 6 mm the hysteroscope was inserted. The findings noted above. The polyp was resected with mysosure. The hysteroscopy  was removed. A Sharp curet was introduced and endometrial curettings were obtained. The hysteroscope was then reinserted. There was no evidence of endometrial polyps or masses with reinsertion of the hysteroscope. There was no evidence of perforation. Hysteroscope was then removed. The single-tooth tenaculum was removed from the anterior lip of the cervix. Excellent hemostasis was noted. The speculum was removed from the  patient's vagina. She was awakened from anesthesia taken care to the recovery room awake and in stable condition. Sponge lap and needle counts were correct x2.  Normal Saline deficit 50 cc

## 2016-11-12 ENCOUNTER — Other Ambulatory Visit: Payer: Self-pay | Admitting: Internal Medicine

## 2016-11-12 DIAGNOSIS — Z1231 Encounter for screening mammogram for malignant neoplasm of breast: Secondary | ICD-10-CM

## 2016-12-28 ENCOUNTER — Ambulatory Visit: Payer: BC Managed Care – PPO

## 2017-02-02 ENCOUNTER — Ambulatory Visit: Payer: BC Managed Care – PPO

## 2017-02-22 ENCOUNTER — Other Ambulatory Visit: Payer: Self-pay | Admitting: Internal Medicine

## 2017-02-22 ENCOUNTER — Ambulatory Visit: Payer: BC Managed Care – PPO

## 2017-02-22 DIAGNOSIS — Z1231 Encounter for screening mammogram for malignant neoplasm of breast: Secondary | ICD-10-CM

## 2017-03-05 ENCOUNTER — Ambulatory Visit
Admission: RE | Admit: 2017-03-05 | Discharge: 2017-03-05 | Disposition: A | Discharge status: Home / Self Care | Payer: BC Managed Care – PPO | Source: Ambulatory Visit | Attending: Internal Medicine | Admitting: Internal Medicine

## 2017-03-05 DIAGNOSIS — Z1231 Encounter for screening mammogram for malignant neoplasm of breast: Secondary | ICD-10-CM

## 2018-04-15 ENCOUNTER — Other Ambulatory Visit: Payer: Self-pay | Admitting: Internal Medicine

## 2018-04-15 DIAGNOSIS — Z1231 Encounter for screening mammogram for malignant neoplasm of breast: Secondary | ICD-10-CM

## 2018-04-18 ENCOUNTER — Ambulatory Visit
Admission: RE | Admit: 2018-04-18 | Discharge: 2018-04-18 | Disposition: A | Discharge status: Home / Self Care | Payer: BC Managed Care – PPO | Source: Ambulatory Visit | Attending: Internal Medicine | Admitting: Internal Medicine

## 2018-04-18 DIAGNOSIS — Z1231 Encounter for screening mammogram for malignant neoplasm of breast: Secondary | ICD-10-CM

## 2018-05-05 ENCOUNTER — Ambulatory Visit: Payer: BC Managed Care – PPO

## 2019-08-03 ENCOUNTER — Other Ambulatory Visit: Payer: Self-pay | Admitting: Internal Medicine

## 2019-08-03 DIAGNOSIS — Z1231 Encounter for screening mammogram for malignant neoplasm of breast: Secondary | ICD-10-CM

## 2019-08-23 ENCOUNTER — Other Ambulatory Visit: Payer: Self-pay | Admitting: Internal Medicine

## 2019-08-23 DIAGNOSIS — E2839 Other primary ovarian failure: Secondary | ICD-10-CM

## 2019-09-18 ENCOUNTER — Ambulatory Visit
Admission: RE | Admit: 2019-09-18 | Discharge: 2019-09-18 | Disposition: A | Discharge status: Home / Self Care | Payer: Medicare Other | Source: Ambulatory Visit | Attending: Internal Medicine | Admitting: Internal Medicine

## 2019-09-18 ENCOUNTER — Other Ambulatory Visit: Payer: Self-pay

## 2019-09-18 DIAGNOSIS — Z1231 Encounter for screening mammogram for malignant neoplasm of breast: Secondary | ICD-10-CM

## 2019-11-09 ENCOUNTER — Other Ambulatory Visit: Payer: BC Managed Care – PPO

## 2020-01-19 ENCOUNTER — Other Ambulatory Visit: Payer: Medicare Other

## 2020-02-01 ENCOUNTER — Ambulatory Visit: Payer: Medicare Other | Attending: Internal Medicine

## 2020-02-01 DIAGNOSIS — Z23 Encounter for immunization: Secondary | ICD-10-CM | POA: Insufficient documentation

## 2020-02-01 NOTE — Progress Notes (Signed)
   Covid-19 Vaccination Clinic  Name:  Patricia Small    MRN: [id number] DOB: [birth year]  02/01/2020  Ms. Patricia Small was observed post Covid-19 immunization for 15 minutes without incidence. She was provided with Vaccine Information Sheet and instruction to access the V-Safe system.   Patricia Small was instructed to call 911 with any severe reactions post vaccine: Marland Kitchen Difficulty breathing  . Swelling of your face and throat  . A fast heartbeat  . A bad rash all over your body  . Dizziness and weakness    Immunizations Administered    Name Date Dose VIS Date Route   Pfizer COVID-19 Vaccine 02/01/2020 12:40 PM 0.3 mL 11/17/2019 Intramuscular   Manufacturer: [employer], [employer]   Lot: [id number]   NDC: [address]-3

## 2020-02-21 ENCOUNTER — Ambulatory Visit: Payer: Medicare Other | Attending: Internal Medicine

## 2020-02-21 DIAGNOSIS — Z23 Encounter for immunization: Secondary | ICD-10-CM

## 2020-02-21 NOTE — Progress Notes (Signed)
   Covid-19 Vaccination Clinic  Name:  Patricia Small    MRN: [id number] DOB: 1956-04-09  02/21/2020  Patricia Small was observed post Covid-19 immunization for 15 minutes without incident. She was provided with Vaccine Information Sheet and instruction to access the V-Safe system.   Patricia Small was instructed to call 911 with any severe reactions post vaccine: Marland Kitchen Difficulty breathing  . Swelling of face and throat  . A fast heartbeat  . A bad rash all over body  . Dizziness and weakness   Immunizations Administered    Name Date Dose VIS Date Route   Pfizer COVID-19 Vaccine 02/21/2020  1:08 PM 0.3 mL 11/17/2019 Intramuscular   Manufacturer: [employer], [employer]   Lot: [id number]   NDC: [address]-1

## 2020-03-27 ENCOUNTER — Other Ambulatory Visit: Payer: Medicare Other

## 2020-05-22 ENCOUNTER — Other Ambulatory Visit: Payer: Medicare Other

## 2020-05-23 ENCOUNTER — Other Ambulatory Visit: Payer: Self-pay

## 2020-05-23 ENCOUNTER — Ambulatory Visit
Admission: RE | Admit: 2020-05-23 | Discharge: 2020-05-23 | Disposition: A | Discharge status: Home / Self Care | Payer: Medicare Other | Source: Ambulatory Visit | Attending: Internal Medicine | Admitting: Internal Medicine

## 2020-05-23 DIAGNOSIS — E2839 Other primary ovarian failure: Secondary | ICD-10-CM

## 2020-09-10 ENCOUNTER — Ambulatory Visit: Payer: Medicare Other | Attending: Internal Medicine

## 2020-09-10 DIAGNOSIS — Z23 Encounter for immunization: Secondary | ICD-10-CM

## 2020-09-10 NOTE — Progress Notes (Signed)
   Covid-19 Vaccination Clinic  Name:  Patricia Small    MRN: [id number] DOB: 06/15/1956  09/10/2020  [name] was observed post Covid-19 immunization for 15 minutes without incident. She was provided with Vaccine Information Sheet and instruction to access the V-Safe system.   [name] was instructed to call 911 with any severe reactions post vaccine: Marland Kitchen Difficulty breathing  . Swelling of face and throat  . A fast heartbeat  . A bad rash all over body  . Dizziness and weakness

## 2020-09-23 ENCOUNTER — Other Ambulatory Visit: Payer: Self-pay | Admitting: Physician Assistant

## 2020-09-23 ENCOUNTER — Ambulatory Visit
Admission: RE | Admit: 2020-09-23 | Discharge: 2020-09-23 | Disposition: A | Discharge status: Home / Self Care | Payer: Medicare Other | Source: Ambulatory Visit | Attending: Physician Assistant | Admitting: Physician Assistant

## 2020-09-23 DIAGNOSIS — M25561 Pain in right knee: Secondary | ICD-10-CM

## 2020-11-18 ENCOUNTER — Other Ambulatory Visit: Payer: Self-pay | Admitting: Internal Medicine

## 2020-11-18 DIAGNOSIS — Z1231 Encounter for screening mammogram for malignant neoplasm of breast: Secondary | ICD-10-CM

## 2020-11-19 ENCOUNTER — Other Ambulatory Visit: Payer: Self-pay

## 2020-11-19 ENCOUNTER — Ambulatory Visit
Admission: RE | Admit: 2020-11-19 | Discharge: 2020-11-19 | Disposition: A | Discharge status: Home / Self Care | Payer: Medicare Other | Source: Ambulatory Visit | Attending: Internal Medicine | Admitting: Internal Medicine

## 2020-11-19 DIAGNOSIS — Z1231 Encounter for screening mammogram for malignant neoplasm of breast: Secondary | ICD-10-CM

## 2021-01-06 DIAGNOSIS — E7800 Pure hypercholesterolemia, unspecified: Secondary | ICD-10-CM | POA: Diagnosis not present

## 2021-01-06 DIAGNOSIS — I10 Essential (primary) hypertension: Secondary | ICD-10-CM | POA: Diagnosis not present

## 2021-01-06 DIAGNOSIS — J4520 Mild intermittent asthma, uncomplicated: Secondary | ICD-10-CM | POA: Diagnosis not present

## 2021-03-27 DIAGNOSIS — I10 Essential (primary) hypertension: Secondary | ICD-10-CM | POA: Diagnosis not present

## 2021-03-27 DIAGNOSIS — J4520 Mild intermittent asthma, uncomplicated: Secondary | ICD-10-CM | POA: Diagnosis not present

## 2021-03-27 DIAGNOSIS — J45901 Unspecified asthma with (acute) exacerbation: Secondary | ICD-10-CM | POA: Diagnosis not present

## 2021-03-27 DIAGNOSIS — E7800 Pure hypercholesterolemia, unspecified: Secondary | ICD-10-CM | POA: Diagnosis not present

## 2021-03-27 DIAGNOSIS — J45909 Unspecified asthma, uncomplicated: Secondary | ICD-10-CM | POA: Diagnosis not present

## 2021-06-02 DIAGNOSIS — J4520 Mild intermittent asthma, uncomplicated: Secondary | ICD-10-CM | POA: Diagnosis not present

## 2021-06-02 DIAGNOSIS — E7800 Pure hypercholesterolemia, unspecified: Secondary | ICD-10-CM | POA: Diagnosis not present

## 2021-06-02 DIAGNOSIS — I10 Essential (primary) hypertension: Secondary | ICD-10-CM | POA: Diagnosis not present

## 2021-06-02 DIAGNOSIS — J45909 Unspecified asthma, uncomplicated: Secondary | ICD-10-CM | POA: Diagnosis not present

## 2021-06-02 DIAGNOSIS — J45901 Unspecified asthma with (acute) exacerbation: Secondary | ICD-10-CM | POA: Diagnosis not present

## 2021-09-23 DIAGNOSIS — J4520 Mild intermittent asthma, uncomplicated: Secondary | ICD-10-CM | POA: Diagnosis not present

## 2021-09-23 DIAGNOSIS — J45901 Unspecified asthma with (acute) exacerbation: Secondary | ICD-10-CM | POA: Diagnosis not present

## 2021-09-23 DIAGNOSIS — I10 Essential (primary) hypertension: Secondary | ICD-10-CM | POA: Diagnosis not present

## 2021-09-23 DIAGNOSIS — E7800 Pure hypercholesterolemia, unspecified: Secondary | ICD-10-CM | POA: Diagnosis not present

## 2021-09-23 DIAGNOSIS — J45909 Unspecified asthma, uncomplicated: Secondary | ICD-10-CM | POA: Diagnosis not present

## 2021-09-24 DIAGNOSIS — E7800 Pure hypercholesterolemia, unspecified: Secondary | ICD-10-CM | POA: Diagnosis not present

## 2021-09-24 DIAGNOSIS — Z5181 Encounter for therapeutic drug level monitoring: Secondary | ICD-10-CM | POA: Diagnosis not present

## 2021-09-24 DIAGNOSIS — J4520 Mild intermittent asthma, uncomplicated: Secondary | ICD-10-CM | POA: Diagnosis not present

## 2021-09-24 DIAGNOSIS — Z0000 Encounter for general adult medical examination without abnormal findings: Secondary | ICD-10-CM | POA: Diagnosis not present

## 2021-09-24 DIAGNOSIS — I10 Essential (primary) hypertension: Secondary | ICD-10-CM | POA: Diagnosis not present

## 2021-11-06 ENCOUNTER — Other Ambulatory Visit: Payer: Self-pay | Admitting: Internal Medicine

## 2021-11-06 DIAGNOSIS — Z1231 Encounter for screening mammogram for malignant neoplasm of breast: Secondary | ICD-10-CM

## 2021-12-15 ENCOUNTER — Ambulatory Visit
Admission: RE | Admit: 2021-12-15 | Discharge: 2021-12-15 | Disposition: A | Discharge status: Home / Self Care | Payer: Medicare Other | Source: Ambulatory Visit | Attending: Internal Medicine | Admitting: Internal Medicine

## 2021-12-15 DIAGNOSIS — Z1231 Encounter for screening mammogram for malignant neoplasm of breast: Secondary | ICD-10-CM | POA: Diagnosis not present

## 2022-02-03 DIAGNOSIS — J45901 Unspecified asthma with (acute) exacerbation: Secondary | ICD-10-CM | POA: Diagnosis not present

## 2022-02-03 DIAGNOSIS — E7800 Pure hypercholesterolemia, unspecified: Secondary | ICD-10-CM | POA: Diagnosis not present

## 2022-02-03 DIAGNOSIS — I10 Essential (primary) hypertension: Secondary | ICD-10-CM | POA: Diagnosis not present

## 2022-04-23 ENCOUNTER — Other Ambulatory Visit: Payer: Self-pay | Admitting: Obstetrics and Gynecology

## 2022-06-05 DIAGNOSIS — Z531 Procedure and treatment not carried out because of patient's decision for reasons of belief and group pressure: Secondary | ICD-10-CM | POA: Diagnosis not present

## 2022-06-10 NOTE — Pre-Procedure Instructions (Signed)
Surgical Instructions    Your procedure is scheduled on Wednesday, July 12.  Report to [facility] Main Entrance "A" at 12:30 P.M., then check in with the Admitting office.  Call this number if you have problems the morning of surgery:  [phone]   If you have any questions prior to your surgery date call [phone]: Open Monday-Friday 8am-4pm    Remember:  Do not eat after midnight the night before your surgery  You may drink clear liquids until 11:30AM the morning of your surgery.   Clear liquids allowed are: Water, Non-Citrus Juices (without pulp), Carbonated Beverages, Clear Tea, Black Coffee ONLY (NO MILK, CREAM OR POWDERED CREAMER of any kind), and Gatorade    Take these medicines the morning of surgery with A SIP OF WATER:   amLODipine (NORVASC) atorvastatin (LIPITOR)  albuterol (PROVENTIL HFA;VENTOLIN HFA) 108 (90 BASE) MCG/ACT inhaler if needed fluticasone (FLONASE) 50 MCG/ACT nasal spray if needed  Please bring all inhalers with you the day of surgery.   As of today, STOP taking any Aspirin (unless otherwise instructed by your surgeon), meloxicam (MOBIC)  Aleve, Naproxen, Ibuprofen, Motrin, Advil, Goody's, BC's, all herbal medications, fish oil, and all vitamins.            [place] is not responsible for any belongings or valuables.   Do NOT Smoke (Tobacco/Vaping)  24 hours prior to your procedure  If you use a CPAP at night, you may bring your mask for your overnight stay.   Contacts, glasses, hearing aids, dentures or partials may not be worn into surgery, please bring cases for these belongings   For patients admitted to the hospital, discharge time will be determined by your treatment team.   Patients discharged the day of surgery will not be allowed to drive home, and someone needs to stay with them for 24 hours.   SURGICAL WAITING ROOM VISITATION Patients having surgery or a procedure in a hospital may have two support people. Children under the  age of 45 must have an adult with them who is not the patient. They may stay in the waiting area during the procedure and may switch out with other visitors. If the patient needs to stay at the hospital during part of their recovery, the visitor guidelines for inpatient rooms apply.  Please refer to the [facility] website for the visitor guidelines for Inpatients (after your surgery is over and you are in a regular room).       Special instructions:    Oral Hygiene is also important to reduce your risk of infection.  Remember - BRUSH YOUR TEETH THE MORNING OF SURGERY WITH YOUR REGULAR TOOTHPASTE   [place]- Preparing For Surgery  Before surgery, you can play an important role. Because skin is not sterile, your skin needs to be as free of germs as possible. You can reduce the number of germs on your skin by washing with CHG (chlorahexidine gluconate) Soap before surgery.  CHG is an antiseptic cleaner which kills germs and bonds with the skin to continue killing germs even after washing.     Please do not use if you have an allergy to CHG or antibacterial soaps. If your skin becomes reddened/irritated stop using the CHG.  Do not shave (including legs and underarms) for at least 48 hours prior to first CHG shower. It is OK to shave your face.  Please follow these instructions carefully.     Shower the NIGHT BEFORE SURGERY and the MORNING OF SURGERY with  CHG Soap.   If you chose to wash your hair, wash your hair first as usual with your normal shampoo. After you shampoo, rinse your hair and body thoroughly to remove the shampoo.  Then [employer] and genitals (private parts) with your normal soap and rinse thoroughly to remove soap.  After that Use CHG Soap as you would any other liquid soap. You can apply CHG directly to the skin and wash gently with a scrungie or a clean washcloth.   Apply the CHG Soap to your body ONLY FROM THE NECK DOWN.  Do not use on open wounds or open sores. Avoid  contact with your eyes, ears, mouth and genitals (private parts). Wash Face and genitals (private parts)  with your normal soap.   Wash thoroughly, paying special attention to the area where your surgery will be performed.  Thoroughly rinse your body with warm water from the neck down.  DO NOT shower/wash with your normal soap after using and rinsing off the CHG Soap.  Pat yourself dry with a CLEAN TOWEL.  Wear CLEAN PAJAMAS to bed the night before surgery  Place CLEAN SHEETS on your bed the night before your surgery  DO NOT SLEEP WITH PETS.   Day of Surgery:  Take a shower with CHG soap. Wear Clean/Comfortable clothing the morning of surgery Do not wear jewelry or makeup Do not wear lotions, powders, perfumes/colognes, or deodorant. Do not shave 48 hours prior to surgery.  Men may shave face and neck. Do not bring valuables to the hospital. Do not wear nail polish, gel polish, artificial nails, or any other type of covering on natural nails (fingers and toes) If you have artificial nails or gel coating that need to be removed by a nail salon, please have this removed prior to surgery. Artificial nails or gel coating may interfere with anesthesia's ability to adequately monitor your vital signs. Remember to brush your teeth WITH YOUR REGULAR TOOTHPASTE.    If you received a COVID test during your pre-op visit, it is requested that you wear a mask when out in public, stay away from anyone that may not be feeling well, and notify your surgeon if you develop symptoms. If you have been in contact with anyone that has tested positive in the last 10 days, please notify your surgeon.    Please read over the following fact sheets that you were given.

## 2022-06-11 ENCOUNTER — Other Ambulatory Visit: Payer: Self-pay

## 2022-06-11 ENCOUNTER — Encounter (HOSPITAL_COMMUNITY)
Admission: RE | Admit: 2022-06-11 | Discharge: 2022-06-11 | Disposition: A | Discharge status: Home / Self Care | Payer: Medicare Other | Source: Ambulatory Visit | Attending: Obstetrics and Gynecology | Admitting: Obstetrics and Gynecology

## 2022-06-11 ENCOUNTER — Encounter (HOSPITAL_COMMUNITY): Payer: Self-pay

## 2022-06-11 VITALS — BP 109/63 | HR 84 | Temp 98.2°F | Resp 17 | Ht 61.0 in | Wt 213.4 lb

## 2022-06-11 DIAGNOSIS — I10 Essential (primary) hypertension: Secondary | ICD-10-CM | POA: Diagnosis not present

## 2022-06-11 DIAGNOSIS — Z01818 Encounter for other preprocedural examination: Secondary | ICD-10-CM | POA: Insufficient documentation

## 2022-06-11 HISTORY — DX: Pure hypercholesterolemia, unspecified: E78.00

## 2022-06-11 LAB — BASIC METABOLIC PANEL
Anion gap: 12 (ref 5–15)
BUN: 10 mg/dL (ref 8–23)
CO2: 28 mmol/L (ref 22–32)
Calcium: 9.4 mg/dL (ref 8.9–10.3)
Chloride: 102 mmol/L (ref 98–111)
Creatinine, Ser: 0.77 mg/dL (ref 0.44–1.00)
GFR, Estimated: >60 mL/min (ref >60)
Glucose, Bld: 107 mg/dL — ABNORMAL HIGH (ref 70–99)
Potassium: 3 mmol/L — ABNORMAL LOW (ref 3.5–5.1)
Sodium: 142 mmol/L (ref 135–145)

## 2022-06-11 LAB — NO BLOOD PRODUCTS

## 2022-06-11 NOTE — Progress Notes (Addendum)
PCP - Renford Dills, MD Cardiologist - denies  PPM/ICD - denies   Chest x-ray - N/A EKG - 06/11/2022  Stress Test - denies ECHO - denies Cardiac Cath - denies  Sleep Study - denies   Blood Thinner Instructions: N/A Aspirin Instructions:N/A  ERAS Protcol - per protocol, no orders  Blood product refusal form with attached acceptable blood derivatives faxed to blood bank and placed in paper chart.  COVID TEST- N/A   Anesthesia review: potassium 3.0  Patient denies shortness of breath, fever, cough and chest pain at PAT appointment   All instructions explained to the patient, with a verbal understanding of the material. Patient agrees to go over the instructions while at home for a better understanding. Patient also instructed to self quarantine after being tested for COVID-19. The opportunity to ask questions was provided.

## 2022-06-11 NOTE — Progress Notes (Signed)
Per Lab CBC need to be drawn d/t lab error. Order placed for DOS.

## 2022-06-12 NOTE — Anesthesia Preprocedure Evaluation (Addendum)
Anesthesia Evaluation  Patient identified by MRN, date of birth, ID band Patient awake    Reviewed: Allergy & Precautions, NPO status , Patient's Chart, lab work & pertinent test results  Airway Mallampati: II  TM Distance: >3 FB Neck ROM: Full    Dental no notable dental hx.    Pulmonary asthma , former smoker,    Pulmonary exam normal breath sounds clear to auscultation       Cardiovascular hypertension, Pt. on medications negative cardio ROS Normal cardiovascular exam Rhythm:Regular Rate:Normal     Neuro/Psych negative neurological ROS  negative psych ROS   GI/Hepatic Neg liver ROS, GERD  ,  Endo/Other  Morbid obesity  Renal/GU negative Renal ROS  negative genitourinary   Musculoskeletal  (+) Arthritis , Osteoarthritis,    Abdominal (+) + obese,   Peds negative pediatric ROS (+)  Hematology negative hematology ROS (+)   Anesthesia Other Findings   Reproductive/Obstetrics negative OB ROS                            Anesthesia Physical Anesthesia Plan  ASA: 3  Anesthesia Plan: General   Post-op Pain Management: Dilaudid IV, Toradol IV (intra-op)* and Ofirmev IV (intra-op)*   Induction: Intravenous  PONV Risk Score and Plan: 3 and Ondansetron, Dexamethasone and Midazolam  Airway Management Planned: LMA  Additional Equipment:   Intra-op Plan:   Post-operative Plan: Extubation in OR  Informed Consent: I have reviewed the patients History and Physical, chart, labs and discussed the procedure including the risks, benefits and alternatives for the proposed anesthesia with the patient or authorized representative who has indicated his/her understanding and acceptance.     Dental advisory given  Plan Discussed with: CRNA  Anesthesia Plan Comments: (PAT note written 06/12/2022 by Shonna Chock, PA-C. For CBC on day of surgery. )       Anesthesia Quick Evaluation

## 2022-06-12 NOTE — Progress Notes (Signed)
Anesthesia Chart Review:   Case: [id number] Date/Time: 06/17/22 1418   Procedure: DILATATION & CURETTAGE/HYSTEROSCOPY WITH MYOSURE   Anesthesia type: Choice   Pre-op diagnosis:      Endometrial Polyp N84.0     PMB N95.0   [facility] ROOM 04 / [facility] OR   Surgeons: Gerald Leitz, MD       DISCUSSION: Patient is a 66 year old female scheduled for the above procedure.  History includes former smoker (quit 02/01/72), HTN, HLD, GERD, asthma, osteoarthritis, appendectomy, obesity.   CBC and BMET labs drawn at 06/11/22 PAT visit; however CBC could not be run due to "lab error". Last H/H 13.7/41.1, PLT 328 on 09/24/21 [facility]).  She signed a Refusal of Blood Form indicating that only albumin or albumin containing products may be administered if deemed necessary to preserve her life or promote recovery--see form for specifics and also attached her Health Care POA form in her shadow chart.  BMET showed glucose 107, Cr 0.77, K 3.0.   I left a voice message for Myrene about CBC not resulting and of K 3.0. I will also send a staff message to Dr. Richardson Dopp. She will get a STAT CBC on arrival, unless otherwise instructed by Dr. Richardson Dopp.   Anesthesia team to evaluate on the day of surgery.    VS: BP 109/63   Pulse 84   Temp 36.8 C (Oral)   Resp 17   Ht 5\' 1"  (1.549 m)   Wt 96.8 kg   LMP 02/01/1992   SpO2 100%   BMI 40.32 kg/m    PROVIDERS: 02/03/1992, MD is PCP    LABS: See DISCUSSION.  (all labs ordered are listed, but only abnormal results are displayed)  Labs Reviewed  BASIC METABOLIC PANEL - Abnormal; Notable for the following components:      Result Value   Potassium 3.0 (*)    Glucose, Bld 107 (*)    All other components within normal limits  NO BLOOD PRODUCTS    EKG: 06/11/22: Normal sinus rhythm Nonspecific ST abnormality Abnormal ECG When compared with ECG of 16-Mar-2016 15:52, No significant change since last tracing Confirmed by 18-Mar-2016 (Donato Schultz) on 06/11/2022  4:34:50 PM  CV: N/A  Past Medical History:  Diagnosis Date   Arthritis    bil. knees and back, shoulder   Asthma    Bilateral carpal tunnel syndrome    Bilateral plantar fasciitis    Bursitis of shoulder    right   GERD (gastroesophageal reflux disease)    no problem in years   High cholesterol    Hypertension    Pneumonia    Sinus infection     Past Surgical History:  Procedure Laterality Date   APPENDECTOMY     BILATERAL CARPAL TUNNEL RELEASE     CESAREAN SECTION     COLONOSCOPY     DILATATION & CURETTAGE/HYSTEROSCOPY WITH MYOSURE N/A 03/18/2016   Procedure: DILATATION & CURETTAGE/HYSTEROSCOPY WITH MYOSURE;  Surgeon: 05/18/2016, MD;  Location: [facility] ORS;  Service: Gynecology;  Laterality: N/A;  Polypectomy   TONSILLECTOMY     TUBAL LIGATION      MEDICATIONS:  albuterol (PROVENTIL HFA;VENTOLIN HFA) 108 (90 BASE) MCG/ACT inhaler   amLODipine (NORVASC) 5 MG tablet   atorvastatin (LIPITOR) 10 MG tablet   BIOTIN PO   bismuth subsalicylate (PEPTO BISMOL) 262 MG chewable tablet   fluticasone (FLONASE) 50 MCG/ACT nasal spray   Homeopathic Products (THERAWORX RELIEF) FOAM   hydrochlorothiazide (HYDRODIURIL) 25 MG tablet  meloxicam (MOBIC) 7.5 MG tablet   montelukast (SINGULAIR) 10 MG tablet   Multiple Vitamins-Minerals (IMMUNE SUPPORT PO)   Multiple Vitamins-Minerals (MULTIVITAMIN PO)   Multiple Vitamins-Minerals (PRESERVISION AREDS 2) CAPS   naproxen sodium (ALEVE) 220 MG tablet   vitamin E 180 MG (400 UNITS) capsule   No current facility-administered medications for this encounter.    Shonna Chock, PA-C Surgical Short Stay/Anesthesiology [facility] Phone [phone] [facility] Phone [phone] 06/12/2022 5:02 PM

## 2022-06-16 ENCOUNTER — Other Ambulatory Visit: Payer: Self-pay | Admitting: Obstetrics and Gynecology

## 2022-06-16 DIAGNOSIS — N950 Postmenopausal bleeding: Secondary | ICD-10-CM

## 2022-06-17 ENCOUNTER — Other Ambulatory Visit: Payer: Self-pay

## 2022-06-17 ENCOUNTER — Ambulatory Visit (HOSPITAL_BASED_OUTPATIENT_CLINIC_OR_DEPARTMENT_OTHER): Payer: Medicare Other | Admitting: Certified Registered Nurse Anesthetist

## 2022-06-17 ENCOUNTER — Ambulatory Visit (HOSPITAL_COMMUNITY)
Admission: RE | Admit: 2022-06-17 | Discharge: 2022-06-17 | Disposition: A | Discharge status: Home / Self Care | Payer: Medicare Other | Attending: Obstetrics and Gynecology | Admitting: Obstetrics and Gynecology

## 2022-06-17 ENCOUNTER — Other Ambulatory Visit: Payer: Self-pay | Admitting: Obstetrics and Gynecology

## 2022-06-17 ENCOUNTER — Ambulatory Visit (HOSPITAL_COMMUNITY): Payer: Medicare Other | Admitting: Vascular Surgery

## 2022-06-17 ENCOUNTER — Encounter (HOSPITAL_COMMUNITY)
Admission: RE | Disposition: A | Discharge status: Home / Self Care | Payer: Self-pay | Source: Home / Self Care | Attending: Obstetrics and Gynecology

## 2022-06-17 ENCOUNTER — Encounter (HOSPITAL_COMMUNITY): Payer: Self-pay | Admitting: Obstetrics and Gynecology

## 2022-06-17 DIAGNOSIS — I10 Essential (primary) hypertension: Secondary | ICD-10-CM

## 2022-06-17 DIAGNOSIS — N840 Polyp of corpus uteri: Secondary | ICD-10-CM | POA: Insufficient documentation

## 2022-06-17 DIAGNOSIS — Z6841 Body Mass Index (BMI) 40.0 and over, adult: Secondary | ICD-10-CM | POA: Insufficient documentation

## 2022-06-17 DIAGNOSIS — Z87891 Personal history of nicotine dependence: Secondary | ICD-10-CM | POA: Diagnosis not present

## 2022-06-17 DIAGNOSIS — N950 Postmenopausal bleeding: Secondary | ICD-10-CM | POA: Diagnosis not present

## 2022-06-17 DIAGNOSIS — M1990 Unspecified osteoarthritis, unspecified site: Secondary | ICD-10-CM | POA: Insufficient documentation

## 2022-06-17 DIAGNOSIS — J45909 Unspecified asthma, uncomplicated: Secondary | ICD-10-CM

## 2022-06-17 HISTORY — PX: DILATATION & CURETTAGE/HYSTEROSCOPY WITH MYOSURE: SHX6511

## 2022-06-17 LAB — CBC
HCT: 40.7 % (ref 36.0–46.0)
Hemoglobin: 13.5 g/dL (ref 12.0–15.0)
MCH: 30.5 pg (ref 26.0–34.0)
MCHC: 33.2 g/dL (ref 30.0–36.0)
MCV: 92.1 fL (ref 80.0–100.0)
Platelets: 295 10*3/uL (ref 150–400)
RBC: 4.42 MIL/uL (ref 3.87–5.11)
RDW: 13.7 % (ref 11.5–15.5)
WBC: 10.2 10*3/uL (ref 4.0–10.5)
nRBC: 0 % (ref 0.0–0.2)

## 2022-06-17 LAB — BASIC METABOLIC PANEL
Anion gap: 11 (ref 5–15)
BUN: 9 mg/dL (ref 8–23)
CO2: 27 mmol/L (ref 22–32)
Calcium: 9.2 mg/dL (ref 8.9–10.3)
Chloride: 104 mmol/L (ref 98–111)
Creatinine, Ser: 0.59 mg/dL (ref 0.44–1.00)
GFR, Estimated: >60 mL/min (ref >60)
Glucose, Bld: 96 mg/dL (ref 70–99)
Potassium: 3.2 mmol/L — ABNORMAL LOW (ref 3.5–5.1)
Sodium: 142 mmol/L (ref 135–145)

## 2022-06-17 SURGERY — DILATATION & CURETTAGE/HYSTEROSCOPY WITH MYOSURE
Anesthesia: General

## 2022-06-17 MED ORDER — HYDROMORPHONE HCL 1 MG/ML IJ SOLN
0.2500 mg | INTRAMUSCULAR | Status: DC | PRN
  Administered 2022-06-17: 0.25 mg via INTRAVENOUS

## 2022-06-17 MED ORDER — HYDROMORPHONE HCL 1 MG/ML IJ SOLN
INTRAMUSCULAR | Status: AC
  Filled 2022-06-17: qty 1

## 2022-06-17 MED ORDER — CHLORHEXIDINE GLUCONATE 0.12 % MT SOLN
OROMUCOSAL | Status: AC
  Administered 2022-06-17: 15 mL via OROMUCOSAL
  Filled 2022-06-17: qty 15

## 2022-06-17 MED ORDER — ORAL CARE MOUTH RINSE: 15.0000 mL | Freq: Once | OROMUCOSAL | Status: AC

## 2022-06-17 MED ORDER — PHENYLEPHRINE 80 MCG/ML (10ML) SYRINGE FOR IV PUSH (FOR BLOOD PRESSURE SUPPORT)
PREFILLED_SYRINGE | INTRAVENOUS | Status: DC | PRN
  Administered 2022-06-17: 160 ug via INTRAVENOUS

## 2022-06-17 MED ORDER — FENTANYL CITRATE (PF) 250 MCG/5ML IJ SOLN
INTRAMUSCULAR | Status: DC | PRN
  Administered 2022-06-17 (×2): 50 ug via INTRAVENOUS

## 2022-06-17 MED ORDER — LACTATED RINGERS IV SOLN: INTRAVENOUS | Status: DC

## 2022-06-17 MED ORDER — AMISULPRIDE (ANTIEMETIC) 5 MG/2ML IV SOLN: 10.0000 mg | Freq: Once | INTRAVENOUS | Status: DC | PRN

## 2022-06-17 MED ORDER — ACETAMINOPHEN 10 MG/ML IV SOLN
INTRAVENOUS | Status: DC | PRN
  Administered 2022-06-17: 1000 mg via INTRAVENOUS

## 2022-06-17 MED ORDER — LIDOCAINE 2% (20 MG/ML) 5 ML SYRINGE
INTRAMUSCULAR | Status: AC
  Filled 2022-06-17: qty 5

## 2022-06-17 MED ORDER — KETOROLAC TROMETHAMINE 15 MG/ML IJ SOLN
INTRAMUSCULAR | Status: DC | PRN
  Administered 2022-06-17: 15 mg via INTRAVENOUS

## 2022-06-17 MED ORDER — BUPIVACAINE HCL (PF) 0.25 % IJ SOLN
INTRAMUSCULAR | Status: AC
  Filled 2022-06-17: qty 30

## 2022-06-17 MED ORDER — OXYCODONE HCL 5 MG PO TABS: 5.0000 mg | ORAL_TABLET | Freq: Four times a day (QID) | ORAL | 0 refills | Status: AC | PRN

## 2022-06-17 MED ORDER — PROMETHAZINE HCL 25 MG/ML IJ SOLN: 6.2500 mg | INTRAMUSCULAR | Status: DC | PRN

## 2022-06-17 MED ORDER — POVIDONE-IODINE 10 % EX SWAB
2.0000 | Freq: Once | CUTANEOUS | Status: AC
  Administered 2022-06-17: 2 via TOPICAL

## 2022-06-17 MED ORDER — MIDAZOLAM HCL 2 MG/2ML IJ SOLN
INTRAMUSCULAR | Status: AC
  Filled 2022-06-17: qty 2

## 2022-06-17 MED ORDER — CHLORHEXIDINE GLUCONATE 0.12 % MT SOLN: 15.0000 mL | Freq: Once | OROMUCOSAL | Status: AC

## 2022-06-17 MED ORDER — PROPOFOL 10 MG/ML IV BOLUS
INTRAVENOUS | Status: DC | PRN
  Administered 2022-06-17: 150 mg via INTRAVENOUS

## 2022-06-17 MED ORDER — LIDOCAINE 2% (20 MG/ML) 5 ML SYRINGE
INTRAMUSCULAR | Status: DC | PRN
  Administered 2022-06-17: 60 mg via INTRAVENOUS

## 2022-06-17 MED ORDER — ONDANSETRON HCL 4 MG/2ML IJ SOLN
INTRAMUSCULAR | Status: DC | PRN
  Administered 2022-06-17: 4 mg via INTRAVENOUS

## 2022-06-17 MED ORDER — OXYCODONE HCL 5 MG/5ML PO SOLN: 5.0000 mg | Freq: Once | ORAL | Status: AC | PRN

## 2022-06-17 MED ORDER — OXYCODONE HCL 5 MG PO TABS
ORAL_TABLET | ORAL | Status: AC
  Filled 2022-06-17: qty 1

## 2022-06-17 MED ORDER — DEXAMETHASONE SODIUM PHOSPHATE 10 MG/ML IJ SOLN
INTRAMUSCULAR | Status: DC | PRN
  Administered 2022-06-17: 4 mg via INTRAVENOUS

## 2022-06-17 MED ORDER — MEPERIDINE HCL 25 MG/ML IJ SOLN: 6.2500 mg | INTRAMUSCULAR | Status: DC | PRN

## 2022-06-17 MED ORDER — ACETAMINOPHEN 10 MG/ML IV SOLN
INTRAVENOUS | Status: AC
  Filled 2022-06-17: qty 100

## 2022-06-17 MED ORDER — MIDAZOLAM HCL 2 MG/2ML IJ SOLN
INTRAMUSCULAR | Status: DC | PRN
  Administered 2022-06-17: 2 mg via INTRAVENOUS

## 2022-06-17 MED ORDER — OXYCODONE HCL 5 MG PO TABS
5.0000 mg | ORAL_TABLET | Freq: Once | ORAL | Status: AC | PRN
  Administered 2022-06-17: 5 mg via ORAL

## 2022-06-17 MED ORDER — FENTANYL CITRATE (PF) 250 MCG/5ML IJ SOLN
INTRAMUSCULAR | Status: AC
  Filled 2022-06-17: qty 5

## 2022-06-17 MED ORDER — DEXAMETHASONE SODIUM PHOSPHATE 10 MG/ML IJ SOLN
INTRAMUSCULAR | Status: AC
  Filled 2022-06-17: qty 1

## 2022-06-17 MED ORDER — ONDANSETRON HCL 4 MG/2ML IJ SOLN
INTRAMUSCULAR | Status: AC
  Filled 2022-06-17: qty 2

## 2022-06-17 MED ORDER — PROPOFOL 10 MG/ML IV BOLUS
INTRAVENOUS | Status: AC
  Filled 2022-06-17: qty 20

## 2022-06-17 SURGICAL SUPPLY — 20 items
CANISTER SUCT 3000ML PPV (MISCELLANEOUS) ×1 IMPLANT
CATH ROBINSON RED A/P 16FR (CATHETERS) ×2 IMPLANT
CNTNR URN SCR LID CUP LEK RST (MISCELLANEOUS) IMPLANT
CONT SPEC 4OZ STRL OR WHT (MISCELLANEOUS) ×2
DEVICE MYOSURE LITE (MISCELLANEOUS) IMPLANT
DEVICE MYOSURE REACH (MISCELLANEOUS) ×1 IMPLANT
DILATOR CANAL MILEX (MISCELLANEOUS) IMPLANT
GLOVE BIOGEL PI IND STRL 6.5 (GLOVE) ×1 IMPLANT
GLOVE BIOGEL PI INDICATOR 6.5 (GLOVE) ×1
GLOVE SURG ENC TEXT LTX SZ6.5 (GLOVE) ×2 IMPLANT
GLOVE SURG UNDER POLY LF SZ7 (GLOVE) ×2 IMPLANT
GOWN STRL REUS W/ TWL LRG LVL3 (GOWN DISPOSABLE) ×2 IMPLANT
GOWN STRL REUS W/TWL LRG LVL3 (GOWN DISPOSABLE) ×4
KIT PROCEDURE FLUENT (KITS) ×2 IMPLANT
KIT TURNOVER KIT B (KITS) ×2 IMPLANT
PACK VAGINAL MINOR WOMEN LF (CUSTOM PROCEDURE TRAY) ×2 IMPLANT
PAD OB MATERNITY 4.3X12.25 (PERSONAL CARE ITEMS) ×2 IMPLANT
SEAL ROD LENS SCOPE MYOSURE (ABLATOR) ×2 IMPLANT
TOWEL GREEN STERILE FF (TOWEL DISPOSABLE) ×4 IMPLANT
UNDERPAD 30X36 HEAVY ABSORB (UNDERPADS AND DIAPERS) ×2 IMPLANT

## 2022-06-17 NOTE — Transfer of Care (Signed)
Immediate Anesthesia Transfer of Care Note  Patient: Patricia Small  Procedure(s) Performed: DILATATION & CURETTAGE/HYSTEROSCOPY WITH MYOSURE  Patient Location: PACU  Anesthesia Type:General  Level of Consciousness: awake, alert  and oriented  Airway & Oxygen Therapy: Patient Spontanous Breathing and Patient connected to nasal cannula oxygen  Post-op Assessment: Report given to RN and Post -op Vital signs reviewed and stable  Post vital signs: Reviewed and stable  Last Vitals:  Vitals Value Taken Time  BP 165/93 06/17/22 1633  Temp    Pulse 86 06/17/22 1635  Resp 16 06/17/22 1635  SpO2 95% 06/17/22 1635  Vitals shown include unvalidated device data.  Last Pain:  Vitals:   06/17/22 1239  TempSrc:   PainSc: 3          Complications: No notable events documented.

## 2022-06-17 NOTE — H&P (Signed)
Date of Initial H&P: 06/17/2022  History reviewed, patient examined, no change in status, stable for surgery.  

## 2022-06-17 NOTE — Anesthesia Postprocedure Evaluation (Signed)
Anesthesia Post Note  Patient: Patricia Small  Procedure(s) Performed: DILATATION & CURETTAGE/HYSTEROSCOPY WITH MYOSURE     Patient location during evaluation: PACU Anesthesia Type: General Level of consciousness: awake and alert, oriented and patient cooperative Pain management: pain level controlled Vital Signs Assessment: post-procedure vital signs reviewed and stable Respiratory status: spontaneous breathing, nonlabored ventilation and respiratory function stable Cardiovascular status: blood pressure returned to baseline and stable Postop Assessment: no apparent nausea or vomiting Anesthetic complications: no   No notable events documented.  Last Vitals:  Vitals:   06/17/22 1650 06/17/22 1705  BP: (!) 144/88 (!) 153/88  Pulse: 81 86  Resp: 18 15  Temp:  37 C  SpO2: 94% 94%    Last Pain:  Vitals:   06/17/22 1635  TempSrc:   PainSc: 6                  Lannie Fields

## 2022-06-17 NOTE — H&P (Signed)
Reason for Appointment   1. Preop (06/17/2022)       History of Present Illness  General:          66 y/o female presents for preop visit. Pt is schedule for a hysteroscopy D&C polypectomy with myosure on 06/17/2022 for the management of PMB and endometrial polyp.         Reports bleeding May 1-7th.         Pelvic ultrasound today 05/05/2022 shows a uterus that measures 9.3 cm x 5.1 cm x 5.9 cm .. 2 small fibroids noted the largest is 1.3 cm they are stable in size compared to ultrasound in 2017.         Endometrium is 9.4 mm.         Ovaries are normal bilaterally         The following is the note from 04/23/2022 with visit with Dr. Dion Body.         Pt started exerciseing in February, She started to increase the intensity in May. One night after exercise, she had a "period" Started bleeding on Tuesday, stopped on Sunday. May 1st-May 7th. Had a light flow with cramping. Used one pantyliner a day. No new medications, no herbs, or supplements.        Not sexually active, no toys.        em biopsy was performed on 04/23/2022 and revealed a benign endometrial polyp.     Current Medications  Taking  Atorvastatin Calcium 10 MG Tablet TAKE 1 TABLET BY MOUTH ONCE DAILY     Singulair(Montelukast Sodium) 10 MG Tablet 1 tablet in the evening Orally Once a day, Notes: prn    amLODIPine Besylate 5 MG Tablet TAKE 1 TABLET BY MOUTH ONCE DAILY     hydroCHLOROthiazide 25 MG Tablet TAKE 1 TABLET BY MOUTH ONCE DAILY     Meloxicam 7.5 MG Tablet TAKE 1 TABLET BY MOUTH ONCE DAILY AS NEEDED     Albuterol Sulfate HFA 108 (90 Base) MCG/ACT Aerosol Solution 2 puffs as needed Inhalation prn    MVI as directed     Biotin 5000 MCG Capsule 1 tablet Orally Once a day    Fluticasone Propionate 50 MCG/ACT Suspension 2 sprays Nasally Once a day    PreserVision AREDS 2 - Capsule 1 capsule Orally once a day    Medication List reviewed and reconciled with the patient         Past Medical History         Arthritis knees, hands, back.         Asthma.         Hypercholesterolemia.         Overweight.         Low back pain.         Hypertension.         Plantar fasciitis, bilateral.         Jehovah's Witness no blood products.         postmenopausal vaginal bleeding 12/2015, to be eval by GYN, had d/c no path found.         Recurrent sinusitis.         Cataracts.        Surgical History         appendectomy          carpal tunnel release B/L          C section          tubal ligation  mole removed left leg 2014          D&C and polypectomy 03/2016         Colonoscopy 06/2018         tubal ligation        Family History   Father: deceased, heart disease, MI, diagnosed with Coronary artery disease   Mother: deceased, heart disease, Cancer Throat, smoker, diagnosed with Coronary artery disease   Sister 1: deceased, died at birth   1 sister(s) . 2 son(s) , 1 daughter(s) .    sister died at birth; denies family h\/o gyn cancers.       Social History  General:   Tobacco use  cigarettes: Never smoked, Tobacco history last updated 06/05/2022, Additional Findings: Tobacco Non-User Non-smoker for religious reasons. Alcohol: yes, Rare. Caffeine: yes, coffee, green tea, ginger tea. no Recreational drug use. Marital Status: married. Children: 2, Boys, 1, daughter (s). 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      Gyn History  Sexual activity not currently sexually active.   Periods : None since D+C and Polypectomy 03/2016.   LMP 03/2016 - as of 04/06/2022 had some bleeding.   Last pap smear date 01/21/2016 negative HPV.   Last mammogram date 12/15/2021- neg.   Denies H/O Abnormal pap smear.   Denies H/O STD.       OB History  Number of pregnancies 4.   miscarriages 1.   Pregnancy # 1 live birth, vaginal delivery, girl.   Pregnancy # 2 miscarriage.   Pregnancy # 3 live birth, vaginal delivery, boy.   Pregnancy # 4:  live birth, C-section, boy.       Allergies   Penicillin (for allergy): rash   Crestor: dizziness - Side Effects       Hospitalization/Major Diagnostic Procedure   surgery    child birth x 3        Review of Systems  CONSTITUTIONAL:         Chills No. Fatigue No. Fever No. Night sweats No. Recent travel outside [place] No. Sweats No. Weight change No.     OPHTHALMOLOGY:         Blurring of vision no. Change in vision no. Double vision no.     ENT:         Dizziness no. Nose bleeds no. Sore throat no. Teeth pain no.     ALLERGY:         Hives no.     CARDIOLOGY:         Chest pain no. High blood pressure no. Irregular heart beat no. Leg edema no. Palpitations no.     RESPIRATORY:         Shortness of breath no. Cough no. Wheezing no.     UROLOGY:         Pain with urination no. Urinary urgency no. Urinary frequency no. Urinary incontinence no. Difficulty urinating No. Blood in urine No.     GASTROENTEROLOGY:         Abdominal pain no. Appetite change no. Bloating/belching no. Blood in stool or on toilet paper no. Change in bowel movements no. Constipation no. Diarrhea no. Difficulty swallowing no. Nausea no.     FEMALE REPRODUCTIVE:         Vulvar pain no. Vulvar rash no. Abnormal vaginal bleeding postmenopausal bleeding. . Breast pain no. Nipple discharge no. Pain with intercourse no. Pelvic pain no. Unusual vaginal discharge no. Vaginal itching no.     MUSCULOSKELETAL:  Muscle aches no.     NEUROLOGY:         Headache no. Tingling/numbness no. Weakness no.     PSYCHOLOGY:         Depression no. Anxiety no. Nervousness no. Sleep disturbances no. Suicidal ideation no .     ENDOCRINOLOGY:         Excessive thirst no. Excessive urination no. Hair loss no. Heat or cold intolerance no.     HEMATOLOGY/LYMPH:         Abnormal bleeding no. Easy bruising no. Swollen glands no.     DERMATOLOGY:         New/changing skin lesion no. Rash no. Sores no.     Negative except as  stated in HPI.      Vital Signs  Wt 213.6, Wt change -6.2 lbs, Ht 61, BMI 40.35, Pulse sitting 76, BP sitting 100/66.     Examination  General Examination:       CONSTITUTIONAL: alert, oriented, NAD . SKIN:  moist, warm. EYES:  Conjunctiva clear. LUNGS:  good I:E efffort noted, clear to auscultation bilaterally. HEART:  regular rate and rhythm. ABDOMEN: soft, non-tender/non-distended, bowel sounds present . FEMALE GENITOURINARY: normal external genitalia, labia - unremarkable, vagina - atrophic mucosa, no lesions or abnormal discharge, cervix - no discharge or lesions or CMT, adnexa - no masses or tenderness, uterus - nontender and normal size on palpation . PSYCH:  affect normal, good eye contact.       Assessments     1. Postmenopausal bleeding - N95.0 (Primary), Awaiting further evaluation by GYN   2. Endometrial polyp - N84.0   3. Blood transfusion declined because patient is Jehovah's Witness - Z53.1     Treatment   1. Postmenopausal bleeding   Notes: d/w pt r/b/a of hysteroscopy D&C polypectomy including but not limited to infection/ bleeding damage to uterus with perforation. .. need for further surgery. pt voiced understanding and desires to proceed. she is advised to avoid NSAIDs 2 weeks prior to surgery.. Avoid eating or drinking after midnight the night prior to surgery .      2. Endometrial polyp   Notes: d/w pt r/b/a of hysteroscopy D&C polypectomy including but not limited to infection/ bleeding damage to uterus with perforation. .. need for further surgery. pt voiced understanding and desires to proceed. she is advised to avoid NSAIDs 2 weeks prior to surgery.. Avoid eating or drinking after midnight the night prior to surgery .

## 2022-06-17 NOTE — H&P (Deleted)
  The note originally documented on this encounter has been moved the the encounter in which it belongs.  

## 2022-06-17 NOTE — Op Note (Signed)
06/17/2022  4:27 PM  PATIENT:  Patricia Small  66 y.o. female  PRE-OPERATIVE DIAGNOSIS:  Endometrial Polyp N84.0 PMB N95.0  POST-OPERATIVE DIAGNOSIS:  Endometrial Polyp N84.0 PMB N95.0  PROCEDURE:  Procedure(s): DILATATION & CURETTAGE/HYSTEROSCOPY WITH MYOSURE (N/A)  SURGEON:  Surgeon(s) and Role:    Gerald Leitz, MD - Primary  PHYSICIAN ASSISTANT:   ASSISTANTS: none   ANESTHESIA:   general  EBL:  20 mL   BLOOD ADMINISTERED:none  DRAINS: none   LOCAL MEDICATIONS USED:  MARCAINE     SPECIMEN:  Source of Specimen:  endometrial polyp and currettings   DISPOSITION OF SPECIMEN:  PATHOLOGY  COUNTS:  YES  TOURNIQUET:  * No tourniquets in log *  DICTATION: .Note written in EPIC  PLAN OF CARE: Discharge to home after PACU  PATIENT DISPOSITION:  PACU - hemodynamically stable.   Delay start of Pharmacological VTE agent (>24hrs) due to surgical blood loss or risk of bleeding: not applicable  Findings Normal external genitalia. Normal vaginal mucosa. Normal appearing cervix.  Endometrial polyp noted . The surrounding endometrium appeared atrophic   Procedure: Patient was taken to operating room #4 where she was placed under general anesthesia. She was placed in the dorsal lithotomy position. She was prepped and draped in the usual sterile fashion. A speculum was placed into the vaginal vault. The anterior lip of the cervix was grasped with a single-tooth tenaculum. Quarter percent Marcaine was injected at the 4 and 8:00 positions of the cervix. The cervix was then sounded to 7 cm. The cervix was dilated to approximately 6 mm. Mysosure operative  hysteroscope was inserted. The findings noted above. Myosure reach  blade was introduced throught the hysteroscope. The endometrial mass was removed in 5 minute.  There was no evidence of perforation. Hysteroscope  was used to collect endometrial curettings. There was no sign of uterine  perforation. .The hysteroscope was removed.  The  single-tooth tenaculum was removed from the anterior lip of the cervix. Excellent hemostasis was noted. The speculum was removed from the patient's vagina. She was awakened from anesthesia taken care  To the recovery  room awake and in stable condition. Sponge lap and needle counts were correct x2.

## 2022-06-17 NOTE — Anesthesia Procedure Notes (Signed)
Procedure Name: LMA Insertion Date/Time: 06/17/2022 3:55 PM  Performed by: Cheree Ditto, CRNAPre-anesthesia Checklist: Patient identified, Emergency Drugs available, Suction available and Patient being monitored Patient Re-evaluated:Patient Re-evaluated prior to induction Oxygen Delivery Method: Circle system utilized Preoxygenation: Pre-oxygenation with 100% oxygen Induction Type: IV induction Ventilation: Mask ventilation without difficulty LMA: LMA inserted LMA Size: 4.0 Number of attempts: 1 Placement Confirmation: breath sounds checked- equal and bilateral and positive ETCO2 Tube secured with: Tape Dental Injury: Teeth and Oropharynx as per pre-operative assessment

## 2022-06-18 ENCOUNTER — Encounter (HOSPITAL_COMMUNITY): Payer: Self-pay | Admitting: Obstetrics and Gynecology

## 2022-06-19 LAB — SURGICAL PATHOLOGY

## 2022-10-06 DIAGNOSIS — M5442 Lumbago with sciatica, left side: Secondary | ICD-10-CM | POA: Diagnosis not present

## 2022-10-06 DIAGNOSIS — M7711 Lateral epicondylitis, right elbow: Secondary | ICD-10-CM | POA: Diagnosis not present

## 2022-10-14 DIAGNOSIS — M545 Low back pain, unspecified: Secondary | ICD-10-CM | POA: Diagnosis not present

## 2022-10-14 DIAGNOSIS — Z0000 Encounter for general adult medical examination without abnormal findings: Secondary | ICD-10-CM | POA: Diagnosis not present

## 2022-10-14 DIAGNOSIS — I10 Essential (primary) hypertension: Secondary | ICD-10-CM | POA: Diagnosis not present

## 2022-10-14 DIAGNOSIS — Z79899 Other long term (current) drug therapy: Secondary | ICD-10-CM | POA: Diagnosis not present

## 2022-10-14 DIAGNOSIS — J4520 Mild intermittent asthma, uncomplicated: Secondary | ICD-10-CM | POA: Diagnosis not present

## 2022-10-14 DIAGNOSIS — E7800 Pure hypercholesterolemia, unspecified: Secondary | ICD-10-CM | POA: Diagnosis not present

## 2022-10-14 DIAGNOSIS — Z5181 Encounter for therapeutic drug level monitoring: Secondary | ICD-10-CM | POA: Diagnosis not present

## 2022-10-23 IMAGING — MG DIGITAL SCREENING BILAT W/ TOMO W/ CAD
6 of 12 series · 6 of 36 positions shown · non-contrast
Comparison: Previous exam(s).

CLINICAL DATA: Screening.

EXAM:
DIGITAL SCREENING BILATERAL MAMMOGRAM WITH TOMO AND CAD

[L CC synth-2D (1 of 2)]
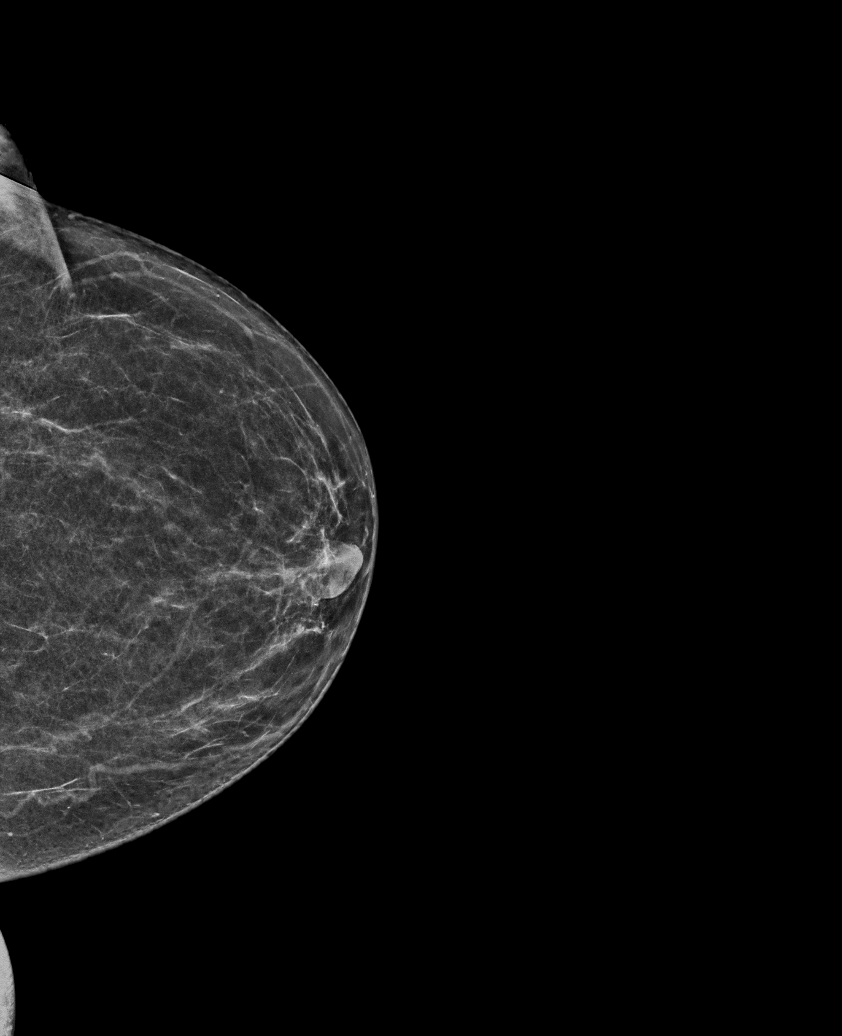

[R CC synth-2D (1 of 2)]
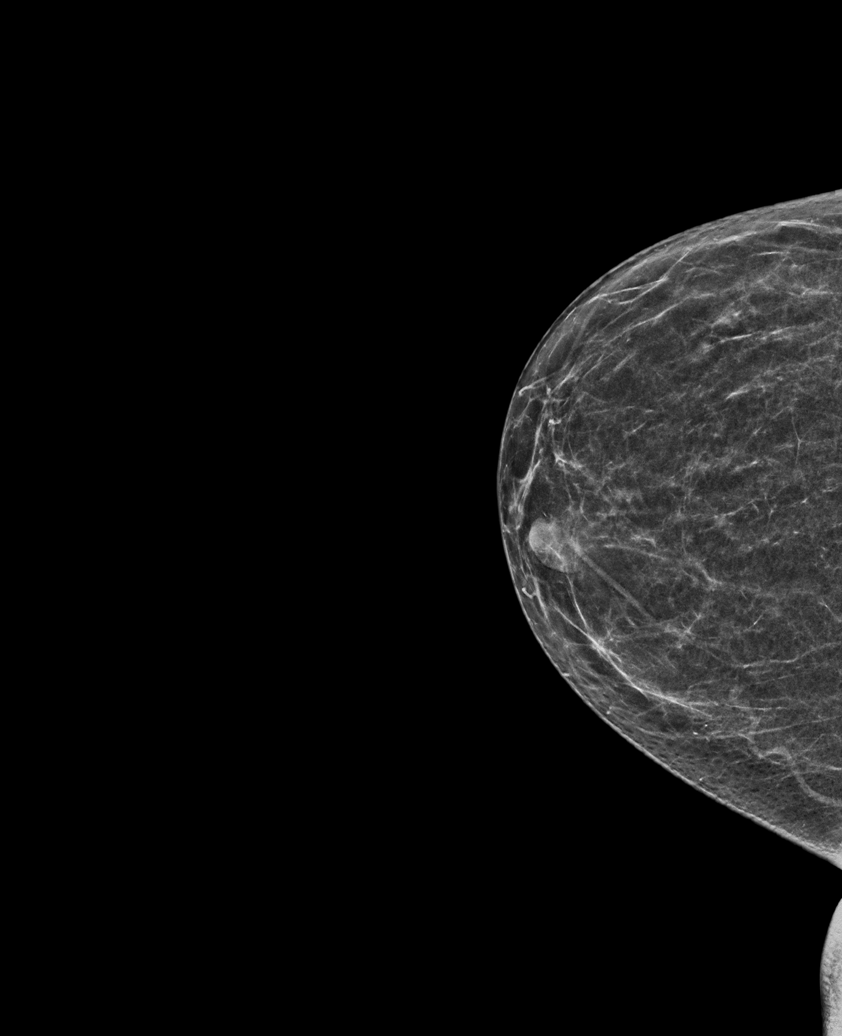

[L MLO synth-2D]
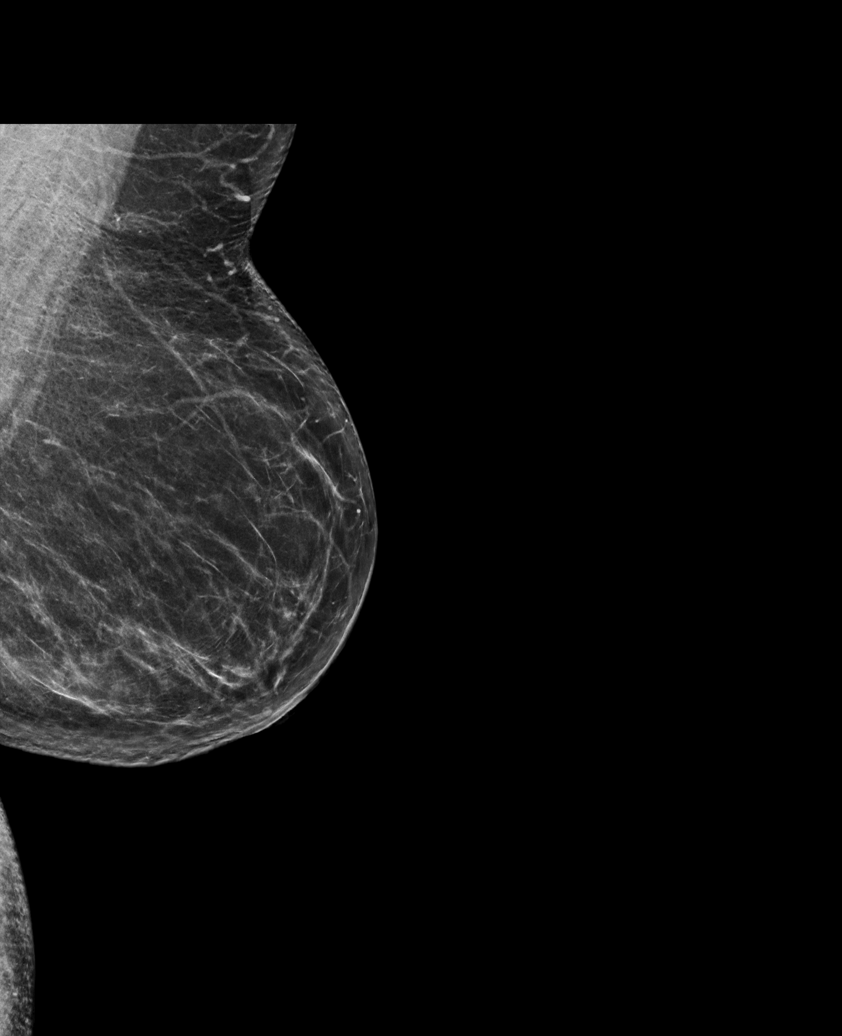

[R CC synth-2D (2 of 2)]
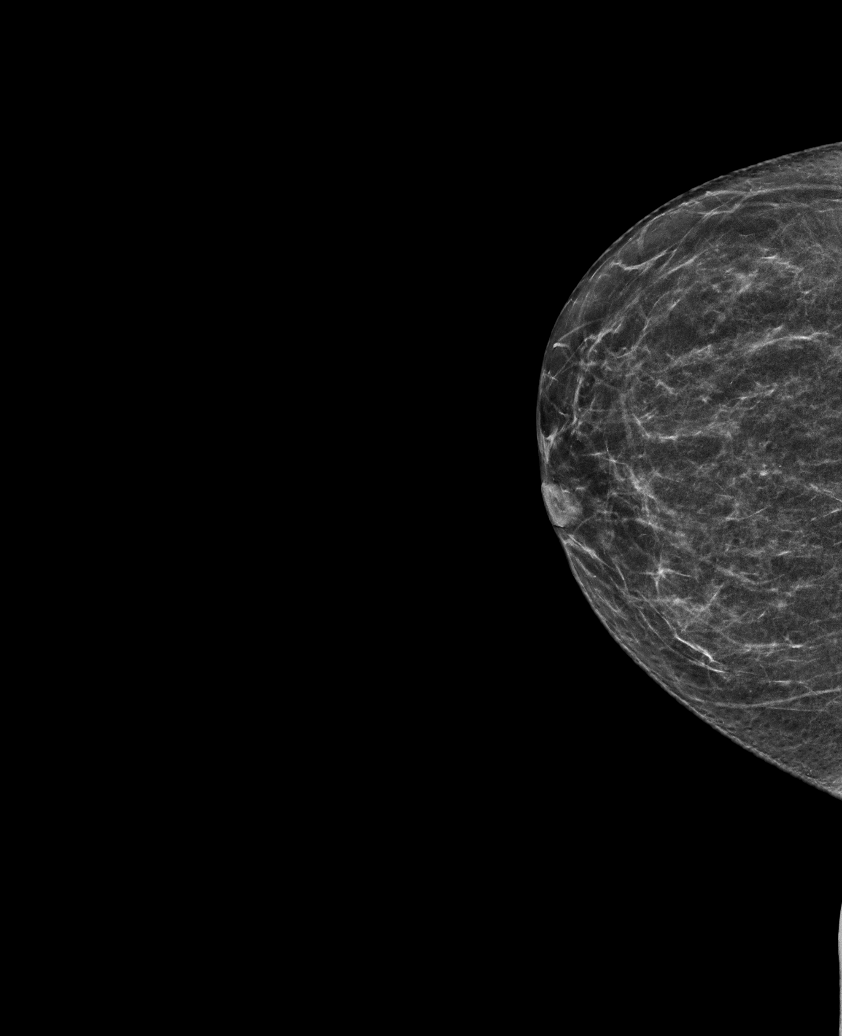

[L CC synth-2D (2 of 2)]
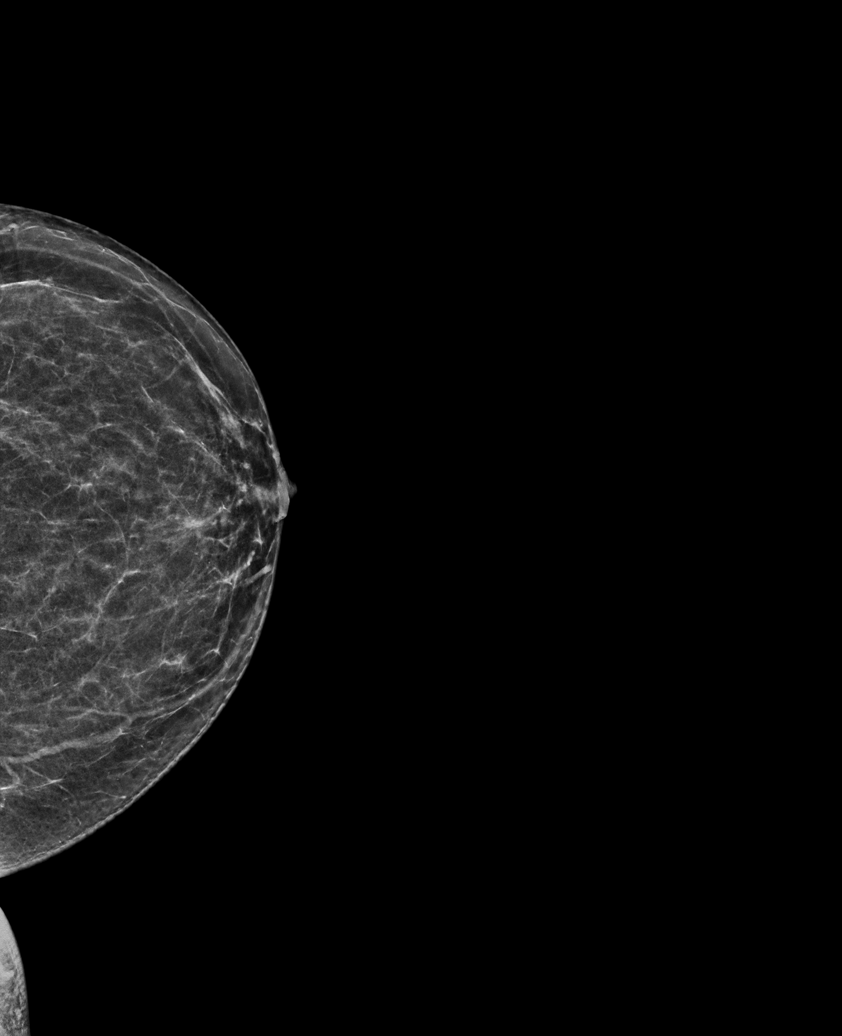

[R MLO synth-2D]
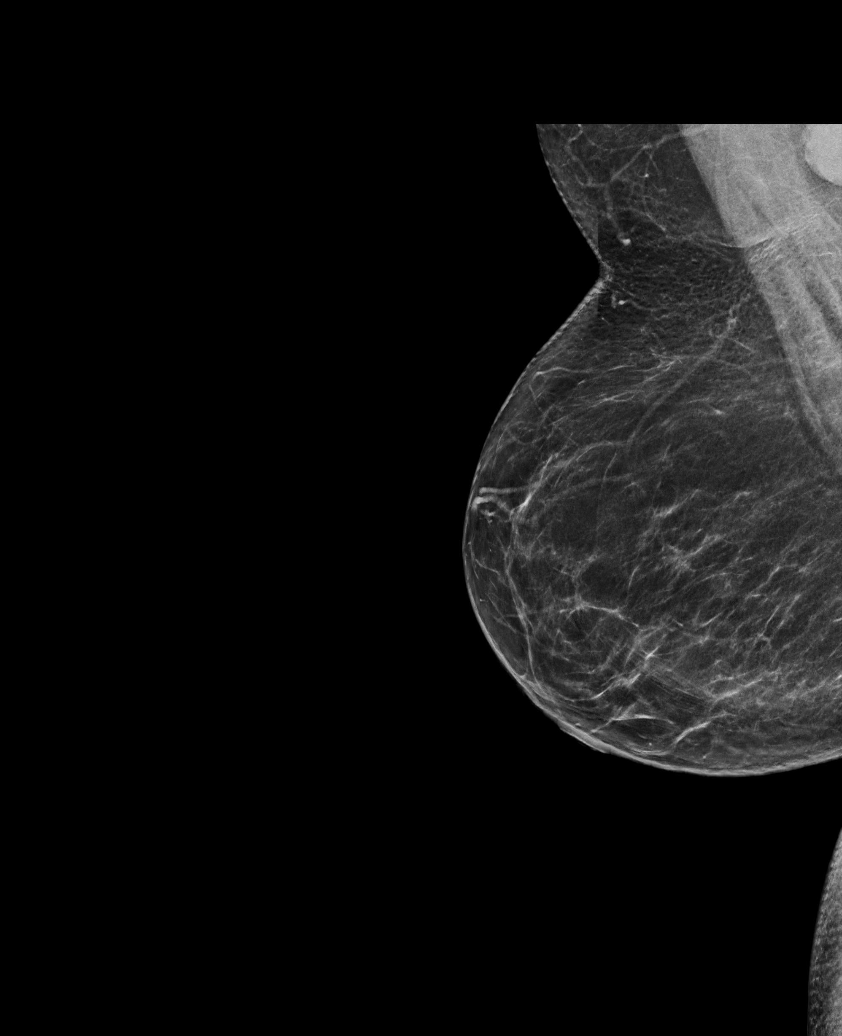

[6 of 36 positions shown; findings below may reference images not displayed]

ACR Breast Density Category b: There are scattered areas of
fibroglandular density.
FINDINGS: There are no findings suspicious for malignancy. Images were
processed with CAD.
IMPRESSION: No mammographic evidence of malignancy. A result letter of this
screening mammogram will be mailed directly to the patient.

RECOMMENDATION:
Screening mammogram in one year. (Code:[id number])

BI-RADS CATEGORY  1: Negative.

## 2022-10-28 DIAGNOSIS — D72829 Elevated white blood cell count, unspecified: Secondary | ICD-10-CM | POA: Diagnosis not present

## 2022-11-16 ENCOUNTER — Other Ambulatory Visit: Payer: Self-pay | Admitting: Internal Medicine

## 2022-11-16 DIAGNOSIS — Z1231 Encounter for screening mammogram for malignant neoplasm of breast: Secondary | ICD-10-CM

## 2023-01-12 ENCOUNTER — Ambulatory Visit
Admission: RE | Admit: 2023-01-12 | Discharge: 2023-01-12 | Disposition: A | Discharge status: Home / Self Care | Payer: Medicare Other | Source: Ambulatory Visit | Attending: Internal Medicine | Admitting: Internal Medicine

## 2023-01-12 DIAGNOSIS — Z1231 Encounter for screening mammogram for malignant neoplasm of breast: Secondary | ICD-10-CM

## 2023-08-03 DIAGNOSIS — H2513 Age-related nuclear cataract, bilateral: Secondary | ICD-10-CM | POA: Diagnosis not present

## 2023-08-03 DIAGNOSIS — H40033 Anatomical narrow angle, bilateral: Secondary | ICD-10-CM | POA: Diagnosis not present

## 2023-10-29 DIAGNOSIS — Z0000 Encounter for general adult medical examination without abnormal findings: Secondary | ICD-10-CM | POA: Diagnosis not present

## 2023-10-29 DIAGNOSIS — Z5181 Encounter for therapeutic drug level monitoring: Secondary | ICD-10-CM | POA: Diagnosis not present

## 2023-10-29 DIAGNOSIS — Z23 Encounter for immunization: Secondary | ICD-10-CM | POA: Diagnosis not present

## 2023-10-29 DIAGNOSIS — J069 Acute upper respiratory infection, unspecified: Secondary | ICD-10-CM | POA: Diagnosis not present

## 2023-10-29 DIAGNOSIS — Z131 Encounter for screening for diabetes mellitus: Secondary | ICD-10-CM | POA: Diagnosis not present

## 2023-10-29 DIAGNOSIS — Z0001 Encounter for general adult medical examination with abnormal findings: Secondary | ICD-10-CM | POA: Diagnosis not present

## 2023-10-29 DIAGNOSIS — E7800 Pure hypercholesterolemia, unspecified: Secondary | ICD-10-CM | POA: Diagnosis not present

## 2023-10-29 DIAGNOSIS — I10 Essential (primary) hypertension: Secondary | ICD-10-CM | POA: Diagnosis not present

## 2023-11-23 DIAGNOSIS — Z7189 Other specified counseling: Secondary | ICD-10-CM | POA: Diagnosis not present

## 2024-02-11 ENCOUNTER — Other Ambulatory Visit: Payer: Self-pay | Admitting: Internal Medicine

## 2024-02-11 DIAGNOSIS — Z1231 Encounter for screening mammogram for malignant neoplasm of breast: Secondary | ICD-10-CM

## 2024-02-22 ENCOUNTER — Ambulatory Visit
Admission: RE | Admit: 2024-02-22 | Discharge: 2024-02-22 | Disposition: A | Discharge status: Home / Self Care | Source: Ambulatory Visit | Attending: Internal Medicine | Admitting: Internal Medicine

## 2024-02-22 DIAGNOSIS — Z1231 Encounter for screening mammogram for malignant neoplasm of breast: Secondary | ICD-10-CM
